# Patient Record
Sex: Male | Born: 1970 | Race: White | Hispanic: No | Marital: Married | Smoking: Never smoker
Health system: Southern US, Community
[De-identification: ages and names within clinical notes are randomized; demographics above are authoritative.]

## PROBLEM LIST (undated history)

## (undated) DIAGNOSIS — F419 Anxiety disorder, unspecified: Secondary | ICD-10-CM

## (undated) DIAGNOSIS — G4733 Obstructive sleep apnea (adult) (pediatric): Secondary | ICD-10-CM

## (undated) DIAGNOSIS — E669 Obesity, unspecified: Secondary | ICD-10-CM

## (undated) DIAGNOSIS — E785 Hyperlipidemia, unspecified: Secondary | ICD-10-CM

## (undated) DIAGNOSIS — I10 Essential (primary) hypertension: Secondary | ICD-10-CM

## (undated) DIAGNOSIS — K4090 Unilateral inguinal hernia, without obstruction or gangrene, not specified as recurrent: Secondary | ICD-10-CM

## (undated) DIAGNOSIS — R3914 Feeling of incomplete bladder emptying: Secondary | ICD-10-CM

## (undated) HISTORY — DX: Obesity, unspecified: E66.9

## (undated) HISTORY — DX: Anxiety disorder, unspecified: F41.9

## (undated) HISTORY — DX: Essential (primary) hypertension: I10

## (undated) HISTORY — PX: OTHER SURGICAL HISTORY: SHX169

## (undated) HISTORY — PX: SHOULDER SURGERY: SHX246

## (undated) HISTORY — PX: GALLBLADDER SURGERY: SHX652

## (undated) HISTORY — DX: Hyperlipidemia, unspecified: E78.5

## (undated) HISTORY — DX: Obstructive sleep apnea (adult) (pediatric): G47.33

---

## 2001-01-08 ENCOUNTER — Encounter: Admission: RE | Admit: 2001-01-08 | Discharge: 2001-04-08 | Payer: Self-pay | Admitting: Anesthesiology

## 2001-12-04 ENCOUNTER — Ambulatory Visit (HOSPITAL_COMMUNITY): Admission: RE | Admit: 2001-12-04 | Discharge: 2001-12-04 | Payer: Self-pay | Admitting: *Deleted

## 2001-12-04 ENCOUNTER — Encounter: Payer: Self-pay | Admitting: *Deleted

## 2001-12-06 ENCOUNTER — Encounter: Payer: Self-pay | Admitting: *Deleted

## 2001-12-06 ENCOUNTER — Ambulatory Visit (HOSPITAL_COMMUNITY): Admission: RE | Admit: 2001-12-06 | Discharge: 2001-12-07 | Payer: Self-pay | Admitting: *Deleted

## 2001-12-06 ENCOUNTER — Encounter: Payer: Self-pay | Admitting: Neurosurgery

## 2001-12-28 ENCOUNTER — Ambulatory Visit (HOSPITAL_COMMUNITY): Admission: RE | Admit: 2001-12-28 | Discharge: 2001-12-28 | Payer: Self-pay | Admitting: *Deleted

## 2001-12-28 ENCOUNTER — Encounter: Payer: Self-pay | Admitting: *Deleted

## 2002-01-07 ENCOUNTER — Ambulatory Visit (HOSPITAL_COMMUNITY): Admission: RE | Admit: 2002-01-07 | Discharge: 2002-01-07 | Payer: Self-pay | Admitting: *Deleted

## 2002-01-07 ENCOUNTER — Encounter: Payer: Self-pay | Admitting: *Deleted

## 2002-01-28 ENCOUNTER — Ambulatory Visit (HOSPITAL_COMMUNITY): Admission: RE | Admit: 2002-01-28 | Discharge: 2002-01-28 | Payer: Self-pay | Admitting: *Deleted

## 2002-01-28 ENCOUNTER — Encounter: Payer: Self-pay | Admitting: *Deleted

## 2002-01-31 ENCOUNTER — Ambulatory Visit (HOSPITAL_COMMUNITY): Admission: RE | Admit: 2002-01-31 | Discharge: 2002-01-31 | Payer: Self-pay | Admitting: *Deleted

## 2002-01-31 ENCOUNTER — Encounter: Payer: Self-pay | Admitting: *Deleted

## 2002-02-04 ENCOUNTER — Ambulatory Visit (HOSPITAL_COMMUNITY): Admission: RE | Admit: 2002-02-04 | Discharge: 2002-02-04 | Payer: Self-pay | Admitting: *Deleted

## 2002-02-04 ENCOUNTER — Encounter: Payer: Self-pay | Admitting: *Deleted

## 2002-02-11 ENCOUNTER — Ambulatory Visit (HOSPITAL_COMMUNITY): Admission: RE | Admit: 2002-02-11 | Discharge: 2002-02-12 | Payer: Self-pay | Admitting: *Deleted

## 2002-02-11 ENCOUNTER — Encounter: Payer: Self-pay | Admitting: *Deleted

## 2002-02-26 ENCOUNTER — Ambulatory Visit (HOSPITAL_COMMUNITY): Admission: RE | Admit: 2002-02-26 | Discharge: 2002-02-26 | Payer: Self-pay | Admitting: *Deleted

## 2002-02-26 ENCOUNTER — Encounter: Payer: Self-pay | Admitting: *Deleted

## 2002-10-02 ENCOUNTER — Emergency Department (HOSPITAL_COMMUNITY): Admission: EM | Admit: 2002-10-02 | Discharge: 2002-10-02 | Payer: Self-pay

## 2005-05-25 ENCOUNTER — Encounter: Payer: Self-pay | Admitting: *Deleted

## 2005-08-26 ENCOUNTER — Observation Stay (HOSPITAL_COMMUNITY): Admission: EM | Admit: 2005-08-26 | Discharge: 2005-08-26 | Payer: Self-pay | Admitting: Emergency Medicine

## 2005-08-26 HISTORY — PX: CARDIAC CATHETERIZATION: SHX172

## 2009-04-13 ENCOUNTER — Encounter: Admission: RE | Admit: 2009-04-13 | Discharge: 2009-04-13 | Payer: Self-pay | Admitting: Internal Medicine

## 2009-04-13 ENCOUNTER — Encounter: Payer: Self-pay | Admitting: Family Medicine

## 2009-04-21 ENCOUNTER — Ambulatory Visit: Payer: Self-pay | Admitting: Family Medicine

## 2009-04-21 DIAGNOSIS — M67919 Unspecified disorder of synovium and tendon, unspecified shoulder: Secondary | ICD-10-CM | POA: Insufficient documentation

## 2009-04-21 DIAGNOSIS — M25519 Pain in unspecified shoulder: Secondary | ICD-10-CM | POA: Insufficient documentation

## 2009-04-21 DIAGNOSIS — M719 Bursopathy, unspecified: Secondary | ICD-10-CM

## 2009-05-05 ENCOUNTER — Encounter: Admission: RE | Admit: 2009-05-05 | Discharge: 2009-05-14 | Payer: Self-pay | Admitting: Sports Medicine

## 2009-05-12 ENCOUNTER — Ambulatory Visit: Payer: Self-pay | Admitting: Sports Medicine

## 2009-05-12 ENCOUNTER — Encounter (INDEPENDENT_AMBULATORY_CARE_PROVIDER_SITE_OTHER): Payer: Self-pay | Admitting: *Deleted

## 2009-05-13 ENCOUNTER — Encounter: Payer: Self-pay | Admitting: Family Medicine

## 2009-05-20 ENCOUNTER — Encounter (INDEPENDENT_AMBULATORY_CARE_PROVIDER_SITE_OTHER): Payer: Self-pay | Admitting: *Deleted

## 2009-05-21 ENCOUNTER — Encounter (INDEPENDENT_AMBULATORY_CARE_PROVIDER_SITE_OTHER): Payer: Self-pay | Admitting: *Deleted

## 2009-05-22 ENCOUNTER — Encounter: Payer: Self-pay | Admitting: Family Medicine

## 2009-07-09 ENCOUNTER — Encounter: Payer: Self-pay | Admitting: Cardiology

## 2009-07-09 HISTORY — PX: NM MYOVIEW LTD: HXRAD82

## 2009-07-23 ENCOUNTER — Ambulatory Visit (HOSPITAL_BASED_OUTPATIENT_CLINIC_OR_DEPARTMENT_OTHER): Admission: RE | Admit: 2009-07-23 | Discharge: 2009-07-23 | Payer: Self-pay | Admitting: Orthopedic Surgery

## 2010-02-23 NOTE — Letter (Signed)
Summary: [facility]  Referral Form  [facility]  Referral Form   Imported By: Marily Memos 04/21/2009 13:52:54  _____________________________________________________________________  External Attachment:    Type:   Image     Comment:   External Document

## 2010-02-23 NOTE — Miscellaneous (Signed)
Summary: ORTHO APPT   PER BRIDGETT DENNIS, CLAIM ADJUSTER, PT HAS APPT ON 05-22-09 WITH DR MURPHY AT 9:15AM. FAXED RECORDS TO [phone].

## 2010-02-23 NOTE — Letter (Signed)
Summary: Out of Work  [facility]  [address]   [place], [place] [address]   Phone: [phone]  Fax: [phone]    May 12, 2009   Employee:  James Stout    To Whom It May Concern:   For Medical reasons, please excuse the above named employee from work for the following dates:  Start:  May 12, 2009   End:  May 12, 2009 4:16 PM   If you need additional information, please feel free to contact our office.         Sincerely,    Lillia Pauls CMA

## 2010-02-23 NOTE — Assessment & Plan Note (Signed)
Summary: F/U,MC   Vital Signs:  Patient profile:   40 year old male BP sitting:   138 / 92  Vitals Entered By: Lillia Pauls CMA (May 12, 2009 3:48 PM)  History of Present Illness: 40 yo M presents for f/u of right shoulder pain.  Patient on 03/24/09 was lifting a case of eggs with his left arm to push it over and felt a pop (similar to a knuckle pop) within his left shoulder. No obvious bruising. Had pain with this. Tried subacromial injection without relief. Tramadol and nsaids minimally helpful. Was felt on ultrasound to have a partial tear of subscap Seen 3 weeks ago by Dr. Patsy Lager - rx'd home exercises and PT Has been compliant with this and shoulder pain is worsening with physical therapy. X-rays done after injury negative for fracture or bony bankart. Is at work on limited duty currently. Pain worse with overhead activities and feels unstable with certain motions.  Allergies (verified): No Known Drug Allergies  Physical Exam  General:  Well-developed,well-nourished,in no acute distress; alert,appropriate and cooperative throughout examination Msk:  Shoulder: LEFT No gross deformity, swelling or bruising. Mild TTP anterior shoulder - minimal at [facility] joint. FROM with painful arc. Strength 4/5 with empty can, 5/5 and not painful with IR/ER. + apprehension sign. Neg drop arm. Mildly positive hawkins and neers. Neg speeds and yergasons. NVI distally.   Impression & Recommendations:  Problem # 1:  SHOULDER PAIN, LEFT (ICD-719.41) Assessment Deteriorated Despite 7 weeks of conservative therapy including physical therapy, patient continues to have pain that is worsening within left shoulder.  Also noted to have + apprehension sign and pain with empty can on today's exam.  Concerned he may have subluxed his shoulder with initial injury and has recurrent instability.  Would expect with partial tear or impingement his symptoms to be improving with injection and therapy and  this is not the case.  Recommend proceeding with orthopedic referral and possible MR arthrogram to delineate damage to labrum which cannot be seen on ultrasound.  Ideally should have this addressed before returning to work.  Will place restrictions of no lifting > 5 pounds.  No overhead activities.  His updated medication list for this problem includes:    Vicodin 5-500 Mg Tabs (Hydrocodone-acetaminophen) .Marland Kitchen... 1 by mouth q 4 hours as needed pain  Complete Medication List: 1)  Vicodin 5-500 Mg Tabs (Hydrocodone-acetaminophen) .Marland Kitchen.. 1 by mouth q 4 hours as needed pain  Patient Instructions: 1)  We will recommend to workers comp that you see an orthopedic surgeon since you are not improving with conservative treatment. 2)  It's ok to continue with physical therapy. 3)  At work no overhead lifting and no lifting over 10 pounds while waiting on the referral. 4)  No driving or operating machinery while taking vicodin. Prescriptions: VICODIN 5-500 MG TABS (HYDROCODONE-ACETAMINOPHEN) 1 by mouth q 4 hours as needed pain  #30 x 0   Entered and Authorized by:   Norton Blizzard MD   Signed by:   Norton Blizzard MD on 05/12/2009   Method used:   Print then Give to Patient   RxID:   [id number]

## 2010-02-23 NOTE — Letter (Signed)
Summary: Out of Work  [facility]  [address]   [place], [place] [address]   Phone: [phone]  Fax: [phone]    April 21, 2009   Employee:  James Stout    To Whom It May Concern:   For Medical reasons, please excuse the above named employee from work for the following dates:  Start:   04/21/3009  End:   4/21/o2011 - limitation of lifting, maximum 10 pounds overhead, f/u with me in 3-4 weeks. OK to roll pallets. Otherwise, no limitation and OK to work  If you need additional information, please feel free to contact our office.         Sincerely,    Hannah Beat MD

## 2010-02-23 NOTE — Assessment & Plan Note (Signed)
Summary: James Stout   Vital Signs:  Patient profile:   40 year old male Height:      71 inches Weight:      252 pounds BMI:     35.27 BP sitting:   142 / 89  Vitals Entered By: Lillia Pauls CMA (April 21, 2009 9:44 AM)  History of Present Illness: 40 year old male:  Initially saw Dr. Alto Denver, no lifting over 10 pounds for a week. Then saw another doctor. Took some xrays then -- thought may have a partial vs. full thickness tear.   Had a biceps tendon rupture on the right shoulder and R SAD, DCE.  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 Has tried some heat, alleve, tried some icy - hot and some cream.  s/p subacromial injection x 1 of the Left shoulder  Had a case of eggs under the bottom and pushed with the palm of his hand, and felt like a knuckle pop.  primary pain with IROM, some with EROM, mild pain with abduction  MISC. 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 Evidence of calcification in subscapularis c/w prior tear and healing calcific tendonitis. Supraspinatus and infraspinatus intact, minimal calfication on supraspinatus. No full thickness tear.  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 Otherwise, the pertinent positives and negatives are listed above and in the HPI, otherwise a full review of systems has been reviewed and is negative unless noted positive.   Physical Exam  General:  GEN: Well-developed,well-nourished,in  no acute distress; alert,appropriate and cooperative throughout examination HEENT: Normocephalic and atraumatic without obvious abnormalities. No apparent alopecia or balding. Ears, externally no deformities PULM: Breathing comfortably in no respiratory distress EXT: No clubbing, cyanosis, or edema PSYCH: Normally interactive. Cooperative during the interview. Pleasant. Friendly and conversant. Not anxious or depressed appearing. Normal, full affect.  Msk:  Shoulder: LEFT Inspection: No muscle wasting or winging Ecchymosis/edema: neg  AC joint, scapula, clavicle: TTP at [facility] Cervical spine: NT, full ROM Spurling's: neg Abduction: full, 5/5 - mild pain arc of moion Flexion: full, 5/5 IR, full, lift-off: 5/5, painful ER at neutral: full, 5/5, mild pain AC crossover: pos Neer: eg Hawkins: pos Drop Test: neg Empty Can: neg Supraspinatus insertion: mild-mod T Bicipital groove: NT Speed's: neg Yergason's: neg Sulcus sign: neg Scapular dyskinesis: none C5-T1 intact  Neuro: Sensation intact Grip 5/5    Impression & Recommendations:  Problem # 1:  ROTATOR CUFF INJURY, LEFT SHOULDER (ICD-726.10) Assessment New  c/w partial thickness, primarily subscap tear with evidence of healing. No bursitis.  Shoulder anatomy was reviewed with the patient. Rotator cuff strengthening and scapular stabilization exercises were reviewed with the patient.   Retraining shoulder mechanics and function was emphasized to the patient with rehab done at least 5-6 days a week.  formal PT to assist with scapular stabilization and RTC strengthening.   cc: Dr. Marny Lowenstein  Orders: US EXTREMITY NON-VASC REAL-TIME IMG [phone])  Problem # 2:  SHOULDER PAIN, LEFT (ICD-719.41) Assessment: New  Patient Instructions: 1)  f/u 3-4 weeks    Appended Document: NP,SHOULDER PAIN,MC OK to call in Vicodin 5/500, 1 by mouth q 4 hours as needed pain, #30, 0 refills  Cannot drive or operate machinery while taking  this.  Appended Document: NP,SHOULDER PAIN,MC Prescriptions: VICODIN 5-500 MG TABS (HYDROCODONE-ACETAMINOPHEN) 1 by mouth q 4 hours as needed pain  #30 x 0   Entered by:   Lillia Pauls CMA   Authorized by:   Hannah Beat MD   Signed by:   Lillia Pauls CMA on 04/27/2009   Method used:   Print then Give to Patient   RxID:   [id number]  called into [facility] on [address]

## 2010-02-23 NOTE — Miscellaneous (Signed)
  NEW ADJUSTER IS : BRIDGETT DENNIS CLAIM NUMBER IS: [id number] [phone]  ALL RECORDS FAXED TO [facility] THEY WILL CALL PT WITH ORTHO APPT

## 2010-02-23 NOTE — Consult Note (Signed)
Summary: Consultation Report  Consultation Report   Imported By: Marily Memos 04/28/2009 09:48:12  _____________________________________________________________________  External Attachment:    Type:   Image     Comment:   External Document

## 2010-02-23 NOTE — Consult Note (Signed)
Summary: [facility]  [facility]   Imported By: Marily Memos 05/28/2009 09:22:08  _____________________________________________________________________  External Attachment:    Type:   Image     Comment:   External Document

## 2010-02-23 NOTE — Letter (Signed)
Summary: *Consult Note  [facility]  [address]   [place], [place] [address]   Phone: [phone]  Fax: [phone]    Re:    James Stout DOB:    21-May-1970   Dear Dr Marny Lowenstein:    Thank you for requesting that we see the above patient for consultation.  A copy of the detailed office note will be sent under separate cover, for your review.  Evaluation today is consistent with:  Left shoulder pain due to shoulder subluxation, rotator cuff tear Failure of 7 weeks of conservative therapy without any improvement  Our recommendation is for:  Orthopedic surgeon referral for consideration of surgical intervention, possibly MR arthrogram to assess labrum, rotator cuff further.  After today's visit, the patients current medications include: 1)  VICODIN 5-500 MG TABS (HYDROCODONE-ACETAMINOPHEN) 1 by mouth q 4 hours as needed pain   Thank you for this consultation.  If you have any further questions regarding the care of this patient, please do not hesitate to contact me @ [phone].  Thank you for this opportunity to look after your patient.  Sincerely,   Norton Blizzard MD

## 2010-04-11 LAB — BASIC METABOLIC PANEL
BUN: 13 mg/dL (ref 6–23)
CO2: 24 mEq/L (ref 19–32)
Calcium: 9.3 mg/dL (ref 8.4–10.5)
Chloride: 105 mEq/L (ref 96–112)
Creatinine, Ser: 0.91 mg/dL (ref 0.4–1.5)
GFR calc Af Amer: >60 mL/min (ref >60)
GFR calc non Af Amer: >60 mL/min (ref >60)
Glucose, Bld: 150 mg/dL — ABNORMAL HIGH (ref 70–99)
Potassium: 4.8 mEq/L (ref 3.5–5.1)
Sodium: 138 mEq/L (ref 135–145)

## 2010-06-11 NOTE — Op Note (Signed)
NAME:  James Stout, James Stout                        ACCOUNT NO.:  [id number]   MEDICAL RECORD NO.:  [id number]                   PATIENT TYPE:  OIB   LOCATION:  3014                                 FACILITY:  [facility]   PHYSICIAN:  Ricky D. Gasper Sells, M.D.             DATE OF BIRTH:  February 29, 1970   DATE OF PROCEDURE:  12/06/2001  DATE OF DISCHARGE:                                 OPERATIVE REPORT   OPERATION:  1. C5-6 and C6-7 microdiskectomy.  2. Corpectomy of C6 for autograft.  3. Strut graft fibular fusion C4 to C6 with allograft and laterally C5-6 and     C6-7 autograft plus EBX bone paste.  4. Fixation with Codman Slimline titanium plate   SURGEON:  Ricky D. Gasper Sells, M.D.   ASSISTANT:  Izell Hamilton. Elesa Hacker, M.D.   COMPLICATIONS:  None.   ESTIMATED BLOOD LOSS:  Less than 100 cc.   INDICATIONS:  This is a 40 year old male who injured his neck in June of  2002. In November 2002 he had an MRI which showed a large herniated nucleus  pulposus at C5-6 and C6-7 on the left. Nevertheless he did not have anything  done for it.  Eventually Dr. Renae Fickle sent him along to see me and I saw him  last week. I did a myelogram CT scan which showed mixed hard and soft disk  on the left at C5-6 and C6-7, moreso at C5-6 and C6-7 consistent with the  numbness and tingling  of the thumb and index into the finger of the left  hand and weakness in the biceps and triceps along with neck pain.   It was elected to decompress him as soon as possible. He was taken to the  operating room where an uncomplicated approach was made to the anterior C5-6  and C6-7 levels, x-ray confirmed. Apart from the obvious nicks hard and soft  disk laterally at C5-6 and C6-7 to the left, the other significant finding  was that  the posterior and longitudinal ligament was markedly thickened and  somewhat more opaque than normal. It did not appear to be calcified.  The  operation itself was uncomplicated. The patient tolerated the  procedure well  and awoke from surgery moving all  four extremities.   DESCRIPTION OF PROCEDURE:  With the patient in the prone position the  patient was prepped and draped in the usual fashion for an anterior approach  to the cervical spine. All pressure points were carefully inspected and  padded and a Bair Hugger blanket was in place.   A 5 cm incision was made in a convenient skin fold in the right anterior  triangle and subcutaneous hemostasis was  achieved using unipolar  coagulation. The  subcutaneous dissection was carried out with the  Metzenbaum scissors and the platysma was split along its muscle fibers,  retracted medially and laterally with a total of  four fishhooks.   Dissection  was carried down to the carotid artery and when that was reached,  the anterior cervical spine was identified. The longus coli muscles were  dissected free and one of the disks which was presumed to be the C4-5 was  marked with a green skin marking pencil and a needle was placed in it and an  intraoperative control x-ray confirmed it was the C4-5 level.  The  shoulders were very prominent. The levels below this level could not be  identified.   Following that the longus coli muscles were dissected free of the C5-C6 and  C7 vertebral bodies. A Caspar pin was placed in the body of C5 and C7, and  then Caspar retractors were placed in the longus coli muscles to expose the  C5 through C7 levels anteriorly and the levels were distracted with the  retractor instrument approximately 3 mm.   Using the microscope for magnification, the annulus of C5-6 and C6-7 was  incised in a trapezoidal fashion, and a diskectomy was performed with the  pituitary instrument. Using the high speed drill, the cartilage endplate of  the C5-6 and C6-7 was then removed and the remaining disk material was  removed out of  the posterior longitudinal ligament, and then with the  cartilage removed from the vertebral body of C6,  a partial corpectomy of C6  was performed and bone was safely made a bony fusion autograft.   Starting at C6-7, the posterior longitudinal ligament was identified and  split with the nerve hook. It was thickened and translucent, but more opaque  than normal. It was removed with 1 mm, 2 mm and 3 mm instruments, Kerrison  punches. The freed edge of the ligament was then coagulated aggressively  with bipolar coagulation. A foraminotomy of C7 was performed to  make sure  that the nerve root had plenty of room. Exactly the same procedures and  observations were carried out at the C5-6 level. Perhaps there was a larger  osteophyte laterally at C6 on the left, but otherwise is essentially the  same procedure.   A custom fashioned tibular strut graft was then formed to fit the C5 through  C7 anteriorly to the appropriate depth and length, and truncated large  dimension anterior, small dimension posterior so as the semi constrained  Codman plate would allow it to collapse into slight lordosis.  This was then  pounded into place, having  countersunk the bone at C5 and C7 somewhat. An  appropriate length Codman titanium Slimline plate was then placed between C5  and C6 and held in place with the temporary pins.   Superiorly a 16 mm self tapping screw was angled superiorly and screwed into  place on the left initially and  then at C7. A 14 mm self tapping screw was  placed and then on the right at C5 and on the left in the same fashion, two  more screws were placed.  In the mid screw, a 14 mm hole was drilled by hand  alongside the bone strut, and 14 mm self tapping screws were inserted at  that level as well. These were all locked in place. An intraoperative x-ray  was not done because we could not see the plate anyway because of the  shoulders.   The wound was then irrigated with Betadine solution and hydrogen peroxide  and finally Bacitracin solution. The self-retaining retractors were  removed. The platysma was closed with interrupted 2-0 Vicryl, and then when it was  closed, vancomycin  solution was injected into the subplatysmal space and was  oozing from that space. Subcutaneous closure was carried out with 4-0 Vicryl  in an interrupted fashion and Steri-Strips were placed in the skin. Some  subcutaneous vancomycin was placed but it expressed itself from the wound  rapidly.   The patient tolerated the procedure well and awoke from surgery  unremarkably.  A  soft dry dressing was placed on the wound and he was  placed in a Newton brace.                                               Ricky D. Gasper Sells, M.D.    RDH/MEDQ  D:  12/06/2001  T:  12/06/2001  Job:  [id number]

## 2010-06-11 NOTE — Op Note (Signed)
NAME:  James Stout, James Stout                        ACCOUNT NO.:  [id number]   MEDICAL RECORD NO.:  [id number]                   PATIENT TYPE:  OIB   LOCATION:  3172                                 FACILITY:  [facility]   PHYSICIAN:  Ricky D. Gasper Sells, M.D.             DATE OF BIRTH:  [birth year]   DATE OF PROCEDURE:  12/28/2001  DATE OF DISCHARGE:                                 OPERATIVE REPORT   PREOPERATIVE DIAGNOSIS:  Loose C5 screw left side, status post anterior disc  and fusion C5-C7, 12/06/2001.   PROCEDURE:  Removal of left 16 millimeter C5 self-drilling screw and  replacement with a blunt screw from the old Codman set which is compatible  with the new set, 15 millimeters, having packed the hole with vancomycin  soaked Surgicel and locking the screw in place. Use of microscope.   SURGEON:  Ricky D. Gasper Sells, M.D.   ASSISTANT:  Cloniger.   ANESTHESIA:  General controlled.   COUNTS:  Sponge, needle and instrument counts correct.   COMPLICATIONS:  Nil.   MEDIA:  Esophagus intact to normal saline, 3% hydrogen peroxide, 50/50 mix,  50 cc mixed with methylene blue, no evidence of problems with the esophagus.   SUMMARY:  This is a 40 year old male, that underwent an anterior C5-C7 strut  graft fusion with carpectomy of C5 and fixation with low profile Codman  titanium plate.  He did very well postoperatively until about five days ago  when he sneezed violently and noted some pain in his neck.  I saw him in the  office in followup.  He was intact neurologically with no arm pain and some  posterior neck pain.  He had the trouble that he was having following  postoperatively resolved.  Unfortunately, however, lateral x-ray done in the  office showed that he backed out one of his upper screws, which turned out  to be the left side.  He must have twisted his neck when he coughed and this  caused that screw to pop out by itself, which is the only mechanism I can  think of.   Because  it was so sharp, I decided to go ahead and explore him before he was  totally scarred down and this was carried out today.  An orogastric tube was  left in his esophagus to facilitate avoiding getting into it when  dissecting.  The integrity of the esophagus at the end of the operation was  assessed and was found to be intact by slowly infusion a total of 50 cc at  50/50 normal saline and hydrogen peroxide, and 1 amp of methyl ine blue,  then withdrawing the tube.  There was no bubbling and no evidence of a leak,  so can assume the esophagus was intact.   The screw was removed.  The other screws were assessed by not only x-ray,  but also palpation, and found to be intact.  No  complications occurred.  The  patient tolerated the procedure well.  The wound was carefully inspected  with the microscope before closure.   DESCRIPTION OF PROCEDURE:  With the patient in the supine position with the  head slightly extended, the patient was prepped and draped, and all pressure  points were inspected and padded in the usual fashion for an anterior  approach to the cervical spine.  Previous right anterior triangle incision  was re-incised.  The mid part of the wound was a little bit broken down,  certainly not infected.  This was resected to be closed in a classic fashion  at the end of the case.  A small elliptical tag of the skin was removed,  probably where there was a suture.   Subcutaneously, superior to the incision, there was another granulomatous  type of reaction to the 2-0 Vicryl used to close the platysma.  This was  resected.  He seemed to be reacting quite strongly to the Vicryl suture  material.  Anyway, the platysma was split and the plane was developed along  the medial border of the sternocleidomastoid muscle and the internal jugular  vein, as well as the carotid.  The anterior cervical spine was encountered.  It had scarred over.  The underlying plate was carefully identified,  keeping  the esophagus well medial.  The protruding screw was identified and  dissected free of the soft tissues.  The surgical assistant, as well as the  staff in the Operating Room, viewed the screw through the microscope and an  interoperative control x-ray was taken to confirm his malposition.   The remainder of the screws were then identified, palpated and found to be  clear, and with the locking cam in place.  Interestingly, this screw jumped  with the cam in place, perhaps because of its angle, perhaps because of the  violence of the patient's sneeze.  Whatever the case, it was removed.  The  screw hole was packed with a 39-cent postage stamp piece of Surgicel soaked  with vancomycin and a 15 mm old system Codman's screw with a blunt tip was  screwed firmly into that hole and then locked into place with the cam.  The  wound was irrigated with Betadine solution and hydrogen peroxide and all  dissection was carried out with hand held retractors to minimize trauma to  the esophagus and trachea, as well as the other structures in the region  including carotid artery, laryngeal nerve and the internal jugular vein.   Under direct visualization the orogastric tube was withdrawn while injecting  a mixture of normal saline and hydrogen peroxide solution with methylene  blue and no evidence of any esophageal tears were identified.  The platysma  was closed with interrupted 4-0 Vicryl sutures and a fine needle times two,  and subcutaneous closure was carried out inverted interrupted 4-0 Vicryl  times four and the skin was closed with Steri-Strips.  The patient tolerated  the procedure well and woke up from the surgery unremarkably,  somewhat  agitated, but moving all four extremities.                                               Ricky D. Gasper Sells, M.D.    RDH/MEDQ  D:  12/28/2001  T:  12/28/2001  Job:  [id number]

## 2010-06-11 NOTE — Discharge Summary (Signed)
NAME:  James Stout, James Stout                        ACCOUNT NO.:  [id number]   MEDICAL RECORD NO.:  [id number]                   PATIENT TYPE:  OIB   LOCATION:  3014                                 FACILITY:  [facility]   PHYSICIAN:  Ricky D. Gasper Sells, M.D.             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   OPERATION:  1. Anterior C5-6 and C6-7 microdiskectomies.  2. Corpectomy of C6 for autograft.  3. Allograft, fibular strut graft and autograft, lateral C5-6 and C6-7, bone     placement for fusion.  4. Placement of Codman titanium plate with 3.5 x 14.0-mm screws in C6 and C7     and 3.5 x 16.0-mm screws in C5.  Postoperative films show good     positioning of fusion, no hardware complications and no problems.   SURGEON:  Ricky D. Gasper Sells, M.D.   ASSISTANT:  Izell Jasper. Deaton, M.D.   SUMMARY:  This is a 40 year old male with a long-standing left-sided C5 and  C6 radiculopathy, both motor and sensory.  He had an MRI a year before and  it showed extradural defects on the left at C5-6 and C6-7 and he chose not  to do anything for this, hoping it would get better.   It did not.  He had a dense numbness of his thumb, index and middle finger  on the left side as well as weakness of the biceps and triceps.  He was  dropping things.  On one occasion, he burned his forearm and decided he  needed to have something done for this.  Meanwhile, he let his insurance  lapse, so he had trouble finding anybody to offer it on him and because he  is on Medicare.  Nevertheless, I had decided to take him in and because he  had clearcut abnormalities on his MRI and clearcut clinical  presentation, we  elected to go ahead and do a myelogram and evaluate him.  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   He was taken to the operating room, where an x-ray-controlled approach was  made to these levels.  The disks were removed.  The C6 and C7 nerve roots  were decompressed in the usual fashion.   The only problem that occurred was that he vomited a little bit while he was  being intubated; there was no clearcut aspiration.   Postoperative films showed some vascular congestion and no consolidation.  He remained afebrile throughout his visit.  His chest is clear at time of  discharge, he had no respiratory difficulty, his O2 saturations were 99% on  2 L, so it was decided to watch him.   From a neurological point of view, his sensation in his left hand improved  to the point where he had a little bit of numbness at the tip of his left  thumb, his index and middle finger were no longer numb, he had no weakness  in the biceps or triceps; in other words, he significantly improved.  His  voice was intact.  He had minimal trouble swallowing, which is normal, which  was improving.  He was discharged home.   DISCHARGE DIAGNOSIS:  Status post C5 through C7 anterior diskectomies and  fusion and fixation with Codman titanium plate.   DISCHARGE MEDICATIONS:  1. Cipro 500 mg p.o. b.i.d.  2. Lorcet 10 mg one every four hours p.r.n.                                               Ricky D. Gasper Sells, M.D.    RDH/MEDQ  D:  12/07/2001  T:  12/08/2001  Job:  [id number]   cc:   Deidre Ala, M.D.  [address]  [place], [place] [address]  Fax: [phone]

## 2010-06-11 NOTE — H&P (Signed)
James Stout, James Stout              ACCOUNT NO.:  [id number]   MEDICAL RECORD NO.:  [id number]          PATIENT TYPE:  OBV   LOCATION:  4738                         FACILITY:  [facility]   PHYSICIAN:  Elmore Guise., M.D.DATE OF BIRTH:  [birth year]   DATE OF ADMISSION:  08/25/2005  DATE OF DISCHARGE:  08/26/2005                                HISTORY & PHYSICAL   INDICATION FOR ADMISSION:  Chest pain.   HISTORY OF PRESENT ILLNESS:  The patient very pleasant 40 year old white  male past medical history of obesity, obstructive sleep apnea, dyslipidemia,  hypertension who presented with acute onset of substernal chest pain.  The  patient reports normal state of health until earlier this evening.  He has  been doing his normal activities without any limitations.  Tonight while  working on a computer he had a acute onset of chest tightness associated  with nausea and shortness of breath.  He also has reported mild sweating and  diaphoresis.  His symptoms continued until he was given one sublingual  nitroglycerin with total relief of symptoms.  He has had no further problems  since being in the emergency department.  EMS did report he was cool, clammy  prior to arrival.   REVIEW OF SYSTEMS:  Positive for off and on abdominal cramping, decreased  energy, fatigue and malaise for the last 2 days.  He has had dyspnea on  exertion and 40 pounds weight gain over the last couple years.  His wife  reports he has a heavy snorer.   CURRENT MEDICATIONS:  Are none.  He used to take felodipine, Zocor and  Toprol.   ALLERGIES:  None.   FAMILY HISTORY:  Is positive for hypertension, diabetes.   SOCIAL HISTORY:  He is married.  He has two children.  No tobacco or  alcohol.   EXAM:  He is afebrile.  Blood pressure 120/60, heart rate 86, respiratory  rate 18.  His sat is 96% on room air.  GENERAL:  He is very pleasant young male alert and oriented x4.  No acute  distress.  He has no JVD and no  bruits.  LUNGS:  Were clear.  HEART:  Is regular with a normal S1-S2.  No significant murmur, gallops,  rubs.  ABDOMEN:  Soft, nontender, nondistended.  No rebound or guarding.  EXTREMITIES:  Warm with 2+ pulses and no significant edema.   LABORATORY VALUES:  Showed a potassium of 3.9, BUN and creatinine of 12 and  1.0.  Cardiac markers were negative x3.  Hemoglobin was 15.8 and D-dimer was  negative.  His chest x-ray showed no acute cardiopulmonary disease.  His ECG  showed normal sinus rhythm, 85 per minute with rightward axis, incomplete  right bundle branch block and poor R-wave progression throughout his  precordial leads.   IMPRESSION:  1. Chest pain.  2. Multiple cardiac risk factors including obesity, dyslipidemia,      hypertension, abnormal ECG.   PLAN:  The patient will be admitted for observation.  We will give one shot  of Lovenox.  Continue aspirin 325 mg daily, serial enzymes.  Will restart  his Toprol, check hemoglobin A1c, TSH, lipids and schedule the patient for  cardiac catheterization.      Elmore Guise., M.D.  Electronically Signed     TWK/MEDQ  D:  09/14/2005  T:  09/14/2005  Job:  [id number]

## 2010-06-11 NOTE — Op Note (Signed)
[facility]  Patient:    James Stout, James Stout Visit Number: [id number] MRN: [id number]          Service Type: PMG Location: [facility] Attending Physician:  Rolly Salter Dictated by:   Jewel Baize Stevphen Rochester, M.D. Proc. Date: 01/09/01 Admit Date:  01/08/2001   CC:         Dr. Renae Fickle   Operative Report  HISTORY OF PRESENT ILLNESS:  [name] comes under pain management today as a kind referral from Dr. Renae Fickle. He is a pleasant 40 year old with a declining functional indices and qualify of life indices secondary to ______ with known degenerative disease at C5-6 and 6-7. He relates this pain primarily on the paracervical region with right and left involvement. He has right and left arm pain, left greater than right involving the thumb and middle finger and consistent distribution C5-6, 6-7. He describes this as numb, sharp, sometimes burning and aching, not improved with medications or rest, heat, ice or therapy, made worse by working. Tests include x-rays and MRI and I reviewed the MRI. He relates no specific injury. He takes no particular medication but took Vicodin with some modest relief cycling.  ALLERGIES:  No declared. He states he has no known drug allergies. He had some nonspecific reaction from an interscalene block, so described, from a shoulder reconstruction on the right side and this sounds ______ possibly even dural violation. He was successfully resuscitated with no residual. This was sometime ago and I relieved there anxiety regarding procedure today. He states no wish to harm self or others. A 14 point review of systems, health and history form are reviewed.  PAST MEDICAL HISTORY:  Remarkable for hypertension, well controlled. Shoulder reconstruction.  FAMILY HISTORY:  Remarkable for lung disease, cancer, diabetes, and hypertension.  SOCIAL HISTORY:  Nonsmoker, nondrinker. He is married. I discussed this procedure with his wife who is present.  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 He does like his job.  Review of systems, family and social history otherwise noncontributory to the pain problem.  PHYSICAL EXAMINATION:  GENERAL:  Reveals a pleasant male sitting comfortably in bed. Gait, affect, and appearance is normal. Oriented x 3.  HEENT:  Unremarkable.  CHEST:  Clear to auscultation and percussion. He has a regular rate and rhythm without rub, murmur or gallop.  ABDOMEN:  Soft, nontender, benign, no hepatosplenomegaly. He has diffuse paracervical myofascial discomfort.  NEUROLOGIC:  Impaired flexion, extension, and lateral rotational pain, positive cervical facetal compression test, left greater than right. He has no other over neurological deficit, motor, sensory reflex.  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  PLAN:  Cervical epidural. He has consent.  DESCRIPTION OF PROCEDURE:  The patient taken to the procedure area and appropriate fluoroscopically imaged. We then prepped and draped the neck in the usual fashion and advanced Hustead needle at C5-6 interspace without any evidence of CSF, heme or paraesthesia. Test block uneventfully following 40 mg of Aristocort and flushed the needle.  Tolerated the procedure well, no complications from our procedure. Discharge instructions were given. Appropriate recovery. Will see him in follow-up. Dictated by:   Jewel Baize Stevphen Rochester, M.D. Attending Physician:  Rolly Salter DD:  01/09/01 TD:  01/10/01 Job: [id number]

## 2010-06-11 NOTE — Discharge Summary (Signed)
James Stout, James Stout              ACCOUNT NO.:  [id number]   MEDICAL RECORD NO.:  [id number]          PATIENT TYPE:  OBV   LOCATION:  4738                         FACILITY:  [facility]   PHYSICIAN:  Elmore Guise., M.D.DATE OF BIRTH:  April 22, 1970   DATE OF ADMISSION:  08/25/2005  DATE OF DISCHARGE:  08/26/2005                                 DISCHARGE SUMMARY   DISCHARGE DIAGNOSES:  1. Non-obstructive coronary arteries.  2. History of hypertension.  3. History of dyslipidemia.  4. Obesity.   HISTORY OF PRESENT ILLNESS:  Patient is a very pleasant 40 year old white  male who was admitted for evaluation of chest pain.  Because of risk factors  and abnormal ECG patient underwent cardiac catheterization which showed  normal-appearing left system with mild luminal irregularities.  His RCA was  dominant with proximal 40-50% stenosis with catheter-induced vasospasm  noted.  Patient's post procedure course was uncomplicated.  We did have a  long discussion regarding his proper treatment.  We discussed weight loss,  continued statin, using nitroglycerin as needed for his vasospasm, continued  long-term aspirin, and beta blocker.  We may add back Plendil if he  continues to be symptomatic with using nitroglycerin more than a couple of  times per week.  His Discharge medications include :   Toprol XL 25 mg daily  Aspirin 325 mg daily  Zocor 40 mg daily, and  Nitroglycerin on a p.r.n. basis.   His follow-up will be with Dr. Lady Deutscher in one to two weeks.  He is to  call the office for appointment.  His restriction should be routine post  catheterization restrictions.  He was instructed not do any strenuous  activity or heavy lifting for the next 48 hours.      Elmore Guise., M.D.  Electronically Signed     TWK/MEDQ  D:  09/14/2005  T:  09/14/2005  Job:  [id number]
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
## 2010-06-11 NOTE — Cardiovascular Report (Signed)
NAMEREHAN, James Stout              ACCOUNT NO.:  [id number]   MEDICAL RECORD NO.:  [id number]          PATIENT TYPE:  INP   LOCATION:  4738                         FACILITY:  [facility]   PHYSICIAN:  Elmore Guise., M.D.DATE OF BIRTH:  January 26, 1970   DATE OF PROCEDURE:  08/26/2005  DATE OF DISCHARGE:                              CARDIAC CATHETERIZATION   INDICATIONS FOR PROCEDURE:  Unstable angina.   DESCRIPTION OF PROCEDURE:  The patient was brought to the cardiac cath lab  after appropriate informed consent.  He was prepped and draped in a sterile  fashion.  Approximately 10 mL of 1% lidocaine was used for local anesthesia.  A 6-French sheath was placed in the right femoral artery without difficulty.  Coronary angiography, LV angiography were performed.  Vessel spasm was noted  on injection of the right coronary artery.  This was treated with 200 mcg of  intracoronary nitroglycerin.  The patient tolerated the procedure well.  No  apparent complications.   FINDINGS:  1.  Left Main:  Normal.  2.  LAD:  Diffuse mild to moderate luminal irregularities.  3.  D1:  Moderate size mild luminal irregularities.  4.  LCX:  Nondominant with mild luminal irregularities.  5.  OM-1:  High branching with mild to moderate luminal irregularities.  6.  Ramus:  Mild to moderate luminal irregularities.  7.  RCA:  Dominant with proximal  eccentric 40-50% stenosis with catheter      induced spasm noted.  8.  PDA and PLV:  No significant disease.  9:  EF of 55-60%.  No wall motion abnormalities.  LVEDP is 13 mmHg.   IMPRESSION:  1.  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  2.  Nonobstructive left anterior descending, left circumflex coronary      artery, ramus intermediate with mild to moderate luminal irregularities.  3.  Normal left ventricular systolic function with an ejection fraction of  55-60%.   PLAN:  Aggressive risk factor modification as indicated.  We will start  Plendil for possible vasospasm component.      Elmore Guise., M.D.  Electronically Signed     TWK/MEDQ  D:  08/26/2005  T:  08/26/2005  Job:  [id number]

## 2010-07-12 ENCOUNTER — Encounter (HOSPITAL_BASED_OUTPATIENT_CLINIC_OR_DEPARTMENT_OTHER)
Admission: RE | Admit: 2010-07-12 | Discharge: 2010-07-12 | Disposition: A | Discharge status: Home / Self Care | Payer: Worker's Compensation | Source: Ambulatory Visit | Attending: Orthopedic Surgery | Admitting: Orthopedic Surgery

## 2010-07-12 LAB — BASIC METABOLIC PANEL
BUN: 11 mg/dL (ref 6–23)
Calcium: 9.1 mg/dL (ref 8.4–10.5)
Creatinine, Ser: 0.85 mg/dL (ref 0.50–1.35)
GFR calc Af Amer: >60 mL/min (ref >60)
GFR calc non Af Amer: >60 mL/min (ref >60)
Glucose, Bld: 170 mg/dL — ABNORMAL HIGH (ref 70–99)

## 2010-07-15 ENCOUNTER — Ambulatory Visit (HOSPITAL_BASED_OUTPATIENT_CLINIC_OR_DEPARTMENT_OTHER)
Admission: RE | Admit: 2010-07-15 | Discharge: 2010-07-15 | Disposition: A | Discharge status: Home / Self Care | Payer: Worker's Compensation | Source: Ambulatory Visit | Attending: Orthopedic Surgery | Admitting: Orthopedic Surgery

## 2010-07-15 DIAGNOSIS — Z01810 Encounter for preprocedural cardiovascular examination: Secondary | ICD-10-CM | POA: Insufficient documentation

## 2010-07-15 DIAGNOSIS — I2510 Atherosclerotic heart disease of native coronary artery without angina pectoris: Secondary | ICD-10-CM | POA: Insufficient documentation

## 2010-07-15 DIAGNOSIS — E669 Obesity, unspecified: Secondary | ICD-10-CM | POA: Insufficient documentation

## 2010-07-15 DIAGNOSIS — M67919 Unspecified disorder of synovium and tendon, unspecified shoulder: Secondary | ICD-10-CM | POA: Insufficient documentation

## 2010-07-15 DIAGNOSIS — Z01812 Encounter for preprocedural laboratory examination: Secondary | ICD-10-CM | POA: Insufficient documentation

## 2010-07-15 DIAGNOSIS — I10 Essential (primary) hypertension: Secondary | ICD-10-CM | POA: Insufficient documentation

## 2010-07-15 DIAGNOSIS — M24119 Other articular cartilage disorders, unspecified shoulder: Secondary | ICD-10-CM | POA: Insufficient documentation

## 2010-07-15 DIAGNOSIS — M719 Bursopathy, unspecified: Secondary | ICD-10-CM | POA: Insufficient documentation

## 2010-07-22 NOTE — Op Note (Signed)
James Stout, HASZ NO.:  [id number]  MEDICAL RECORD NO.:  [id number]  LOCATION:                                 FACILITY:  PHYSICIAN:  Loreta Ave, M.D.      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  Previous acromioplasty, decompression, and repair of the tear of the infra and supraspinatus tendons.  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  Previous acromioplasty, decompression, and repair of the tear of the infra and supraspinatus tendons with evidence of failure of the supraspinatus repair as a result of the tissue quality of the tendon.  The anchors and sutures were still in place, they are just pulled out from the cuff in the supraspinatus, which had not healed. The infraspinatus healed.  There was also complex tearing posterior as well as superior labrum.  PROCEDURES:  Left shoulder exam under anesthesia, arthroscopy, debridement of labrum.  Debridement and mobilization of cuff.  Repeat repair with fiber weave suture x3, swivel lock anchors x3.  SURGEON:  Loreta Ave, MD  ASSISTANT:  Genene Churn. Barry Dienes, [place], present throughout the entire case necessary for timely completion of procedure.  ANESTHESIA:  General.  BLOOD LOSS:  Minimal.  SPECIMENS:  None.  CULTURES:  None.  COMPLICATIONS:  None.  DRESSING:  Soft compressive shoulder immobilizer.  PROCEDURE IN DETAIL:  The patient was brought to the operating room, placed on the operating table in supine position.  After adequate anesthesia had been obtained, shoulder examined.  Full motion stable shoulder.  Placed in beach-chair position on a shoulder positioner, prepped and draped in usual sterile fashion.  Three portals, anterior, posterior, and lateral.  Arthroscope  introduced.  The shoulder distended and inspected.  Some complex tearing of the labral on the top from the little of the back all debrided.  Biceps tendon and biceps anchor intact.  Infraspinatus had healed, but the supraspinatus had just been thin, torn away from the sutures throughout the anterior half and extending a little bit further back to that.  This was debrided from above and below.  Although the tendon had pulled out, this still appeared to be relatively reasonable tissue quality to attempt debris repair.  After mobilizing the cuff for recurrent repair, I looked above and below.  Tuberosity was roughened.  Remaining exposed sutures were removed.  A bur was used to roughened tuberosity of the bleeding bone. Adequacy of previous decompression and distal clavicle excision confirmed.  A fourth posterolateral portal was utilized.  The cuff was captured with three horizontal mattress sutures.  A two outwards and one much more medial.  I brought the sutures out sequentially, taking the front suture and the anterior limb of the middle one anchoring it down into a new swivel lock anchor into the roughened tuberosity.  I then did the same with the posterior and the posterior limb of the middle suture and down in the back.  This gave me a nice firm repair of the cuff without undue tension  even through full passive motion.  Every effort was made to try to freshen up whole site to try to get this to heal.  I was pleased with the overall constructing repair and completion, visualizing this from above and below.  Instruments and fluid removed. Portals closed with nylon.  Sterile compressive dressing applied. Shoulder mobilizer and applied.  Anesthesia reversed.  Brought to the recovery room.  Tolerated surgery well.  No complications.     Loreta Ave, M.D.     DFM/MEDQ  D:  07/15/2010  T:  07/16/2010  Job:  [id number]  Electronically Signed by Mckinley Jewel M.D. on 07/22/2010  02:19:24 PM

## 2010-09-04 ENCOUNTER — Observation Stay (HOSPITAL_COMMUNITY)
Admission: EM | Admit: 2010-09-04 | Discharge: 2010-09-04 | Disposition: A | Discharge status: Home / Self Care | Payer: Medicaid Other | Attending: Emergency Medicine | Admitting: Emergency Medicine

## 2010-09-04 DIAGNOSIS — T7840XA Allergy, unspecified, initial encounter: Principal | ICD-10-CM | POA: Insufficient documentation

## 2010-09-04 DIAGNOSIS — R6889 Other general symptoms and signs: Secondary | ICD-10-CM | POA: Insufficient documentation

## 2010-09-04 LAB — POCT I-STAT, CHEM 8
Calcium, Ion: 1.14 mmol/L (ref 1.12–1.32)
Creatinine, Ser: 1.1 mg/dL (ref 0.50–1.35)
Glucose, Bld: 153 mg/dL — ABNORMAL HIGH (ref 70–99)
HCT: 52 % (ref 39.0–52.0)
Hemoglobin: 17.7 g/dL — ABNORMAL HIGH (ref 13.0–17.0)
TCO2: 22 mmol/L (ref 0–100)

## 2010-09-04 LAB — TROPONIN I: Troponin I: <0.3 ng/mL (ref <0.30)

## 2010-09-04 LAB — CK TOTAL AND CKMB (NOT AT ARMC)
CK, MB: 3.8 ng/mL (ref 0.3–4.0)
Relative Index: 3.4 — ABNORMAL HIGH (ref 0.0–2.5)
Total CK: 111 U/L (ref 7–232)

## 2010-09-04 LAB — POCT I-STAT TROPONIN I: Troponin i, poc: 0.16 ng/mL (ref 0.00–0.08)

## 2011-06-07 ENCOUNTER — Encounter: Payer: Self-pay | Admitting: *Deleted

## 2011-10-13 ENCOUNTER — Encounter (HOSPITAL_COMMUNITY): Payer: Self-pay | Admitting: Emergency Medicine

## 2011-10-13 ENCOUNTER — Emergency Department (HOSPITAL_COMMUNITY): Payer: Self-pay

## 2011-10-13 ENCOUNTER — Emergency Department (HOSPITAL_COMMUNITY)
Admission: EM | Admit: 2011-10-13 | Discharge: 2011-10-13 | Disposition: A | Discharge status: Home / Self Care | Payer: Self-pay | Attending: Emergency Medicine | Admitting: Emergency Medicine

## 2011-10-13 DIAGNOSIS — S93409A Sprain of unspecified ligament of unspecified ankle, initial encounter: Secondary | ICD-10-CM | POA: Insufficient documentation

## 2011-10-13 DIAGNOSIS — F411 Generalized anxiety disorder: Secondary | ICD-10-CM | POA: Insufficient documentation

## 2011-10-13 DIAGNOSIS — G4733 Obstructive sleep apnea (adult) (pediatric): Secondary | ICD-10-CM | POA: Insufficient documentation

## 2011-10-13 DIAGNOSIS — E785 Hyperlipidemia, unspecified: Secondary | ICD-10-CM | POA: Insufficient documentation

## 2011-10-13 DIAGNOSIS — M25579 Pain in unspecified ankle and joints of unspecified foot: Secondary | ICD-10-CM | POA: Insufficient documentation

## 2011-10-13 DIAGNOSIS — Z79899 Other long term (current) drug therapy: Secondary | ICD-10-CM | POA: Insufficient documentation

## 2011-10-13 DIAGNOSIS — I10 Essential (primary) hypertension: Secondary | ICD-10-CM | POA: Insufficient documentation

## 2011-10-13 DIAGNOSIS — X500XXA Overexertion from strenuous movement or load, initial encounter: Secondary | ICD-10-CM | POA: Insufficient documentation

## 2011-10-13 MED ORDER — OXYCODONE-ACETAMINOPHEN 5-325 MG PO TABS: 1.0000 | ORAL_TABLET | Freq: Four times a day (QID) | ORAL | Status: AC | PRN

## 2011-10-13 NOTE — Progress Notes (Signed)
Orthopedic Tech Progress Note Patient Details:  James Stout Dec 12, 1970 [id number]  Ortho Devices Type of Ortho Device: ASO Ortho Device/Splint Location: right ankle Ortho Device/Splint Interventions: Application   [name] 10/13/2011, 3:07 PM

## 2011-10-13 NOTE — ED Provider Notes (Signed)
History  Scribed for [employer]. Rubin Payor, MD, the patient was seen in room TR08C/TR08C. This chart was scribed by Candelaria Stagers. The patient's care started at 3:16 PM   CSN: [id number]  Arrival date & time 10/13/11  1359   First MD Initiated Contact with Patient 10/13/11 1420      Chief Complaint  Patient presents with  . Ankle Injury    Right ankle   The history is provided by the patient. No language interpreter was used.   James Stout is a 41 y.o. male who presents to the Emergency Department complaining of right ankle pain after stepping in a hole earlier today and rolling the ankle inward.  Pt denies hitting his head and landed on his elbows.  He has no other injuries.  Nothing seems to make the pain better or worse.  Past Medical History  Diagnosis Date  . Hypertension   . Anxiety   . Obesity   . Obstructive sleep apnea   . Dyslipidemia   . Chest pain     Past Surgical History  Procedure Date  . Right shoulder bicep tendon repair x 2   . Neck fusion surgery x 3   . Gallbladder surgery   . Nm myoview ltd 07/09/2009    normal/ no ischemia  . Cardiac catheterization 08/26/2005    moderate nonobstructive proximal RCA disease with 40-50 % proximal stenosis/catheter induced spasm noted/ LVF- 55-60%  . Shoulder surgery left    No family history on file.  History  Substance Use Topics  . Smoking status: Never Smoker   . Smokeless tobacco: Not on file  . Alcohol Use: Yes     OCC      Review of Systems  Musculoskeletal: Positive for arthralgias (right ankle pain).  All other systems reviewed and are negative.    Allergies  Review of patient's allergies indicates no known allergies.  Home Medications   Current Outpatient Rx  Name Route Sig Dispense Refill  . CARVEDILOL 6.25 MG PO TABS Oral Take 6.25 mg by mouth 2 (two) times daily with a meal.    . CITALOPRAM HYDROBROMIDE 40 MG PO TABS Oral Take 40 mg by mouth at bedtime.    Marland Kitchen ZOLPIDEM TARTRATE 10  MG PO TABS Oral Take 10 mg by mouth at bedtime as needed. For insomnia.    Marland Kitchen NITROGLYCERIN 0.4 MG SL SUBL Sublingual Place 0.4 mg under the tongue every 5 (five) minutes as needed. For chest pain.    . OXYCODONE-ACETAMINOPHEN 5-325 MG PO TABS Oral Take 1-2 tablets by mouth every 6 (six) hours as needed for pain. 10 tablet 0    BP 142/94  Pulse 82  Temp 98 F (36.7 C) (Oral)  Resp 16  SpO2 96%  Physical Exam  Nursing note and vitals reviewed. Constitutional: He is oriented to person, place, and time. He appears well-developed and well-nourished. No distress.  HENT:  Head: Normocephalic and atraumatic.  Eyes: EOM are normal. Pupils are equal, round, and reactive to light.  Neck: Neck supple. No tracheal deviation present.  Pulmonary/Chest: Effort normal. No respiratory distress.  Musculoskeletal:       Right Ankle: Tenderness over the lateral malleolus.  No tenderness at the base of the 5th metatarsal.  Tenderness over the medial malleolus.  Mild Swelling.  Neurovascularly intact.  Able to wiggle toes.      Neurological: He is alert and oriented to person, place, and time. No sensory deficit.  Skin: Skin is warm and  dry.  Psychiatric: He has a normal mood and affect. His behavior is normal.    ED Course  Procedures   COORDINATION OF CARE:  14:08 Ordered: DG Ankle Complete Right 14:48 Ordered: Apply ASO ankle   Labs Reviewed - No data to display Dg Ankle Complete Right  10/13/2011  *RADIOLOGY REPORT*  Clinical Data: Right ankle injury.  Pain and swelling along the lateral aspect.  RIGHT ANKLE - COMPLETE 3+ VIEW  Comparison: None.  Findings: Extensive soft tissue swelling is present over the lateral malleolus.  The ankle is located.  No acute osseous abnormality is present.  Calcaneal spurs are evident.  IMPRESSION: Extensive soft tissue swelling over lateral malleolus without an underlying fracture.  Ligamentous injury is not excluded.   Original Report Authenticated By:  Jamesetta Orleans. MATTERN, M.D.      1. Ankle sprain       MDM  Patient with right lateral ankle injury. No tenderness over fibula. X-ray does not show fracture. Patient was given an ASO and will followup with Ortho as  I personally performed the services described in this documentation, which was scribed in my presence. The recorded information has been reviewed and considered.         Juliet Rude. Rubin Payor, MD 10/13/11 1517

## 2011-10-13 NOTE — ED Notes (Signed)
Pt reports stepped into a hole and twisted right ankle. Right ankle swollen and bruised at present. Pulses present. Pt can wiggle toes.

## 2013-05-03 ENCOUNTER — Emergency Department (HOSPITAL_COMMUNITY): Payer: Medicaid Other

## 2013-05-03 ENCOUNTER — Emergency Department (HOSPITAL_COMMUNITY)
Admission: EM | Admit: 2013-05-03 | Discharge: 2013-05-03 | Disposition: A | Discharge status: Home / Self Care | Payer: Medicaid Other | Attending: Emergency Medicine | Admitting: Emergency Medicine

## 2013-05-03 ENCOUNTER — Encounter (HOSPITAL_COMMUNITY): Payer: Self-pay | Admitting: Emergency Medicine

## 2013-05-03 DIAGNOSIS — S43005A Unspecified dislocation of left shoulder joint, initial encounter: Secondary | ICD-10-CM

## 2013-05-03 DIAGNOSIS — Z79899 Other long term (current) drug therapy: Secondary | ICD-10-CM | POA: Insufficient documentation

## 2013-05-03 DIAGNOSIS — Z9889 Other specified postprocedural states: Secondary | ICD-10-CM | POA: Insufficient documentation

## 2013-05-03 DIAGNOSIS — W28XXXA Contact with powered lawn mower, initial encounter: Secondary | ICD-10-CM | POA: Insufficient documentation

## 2013-05-03 DIAGNOSIS — S43004A Unspecified dislocation of right shoulder joint, initial encounter: Secondary | ICD-10-CM

## 2013-05-03 DIAGNOSIS — Y929 Unspecified place or not applicable: Secondary | ICD-10-CM | POA: Insufficient documentation

## 2013-05-03 DIAGNOSIS — R296 Repeated falls: Secondary | ICD-10-CM | POA: Insufficient documentation

## 2013-05-03 DIAGNOSIS — I10 Essential (primary) hypertension: Secondary | ICD-10-CM | POA: Insufficient documentation

## 2013-05-03 DIAGNOSIS — F411 Generalized anxiety disorder: Secondary | ICD-10-CM | POA: Insufficient documentation

## 2013-05-03 DIAGNOSIS — Y9389 Activity, other specified: Secondary | ICD-10-CM | POA: Insufficient documentation

## 2013-05-03 DIAGNOSIS — S43006A Unspecified dislocation of unspecified shoulder joint, initial encounter: Secondary | ICD-10-CM | POA: Insufficient documentation

## 2013-05-03 DIAGNOSIS — E669 Obesity, unspecified: Secondary | ICD-10-CM | POA: Insufficient documentation

## 2013-05-03 DIAGNOSIS — R61 Generalized hyperhidrosis: Secondary | ICD-10-CM | POA: Insufficient documentation

## 2013-05-03 IMAGING — CR DG HUMERUS 2V *L*
2 series · 2 of 2 positions shown · non-contrast
Comparison: None.

CLINICAL DATA: SHOULDER INJURY

EXAM:
LEFT HUMERUS - 2+ VIEW

[x humerus ap left]
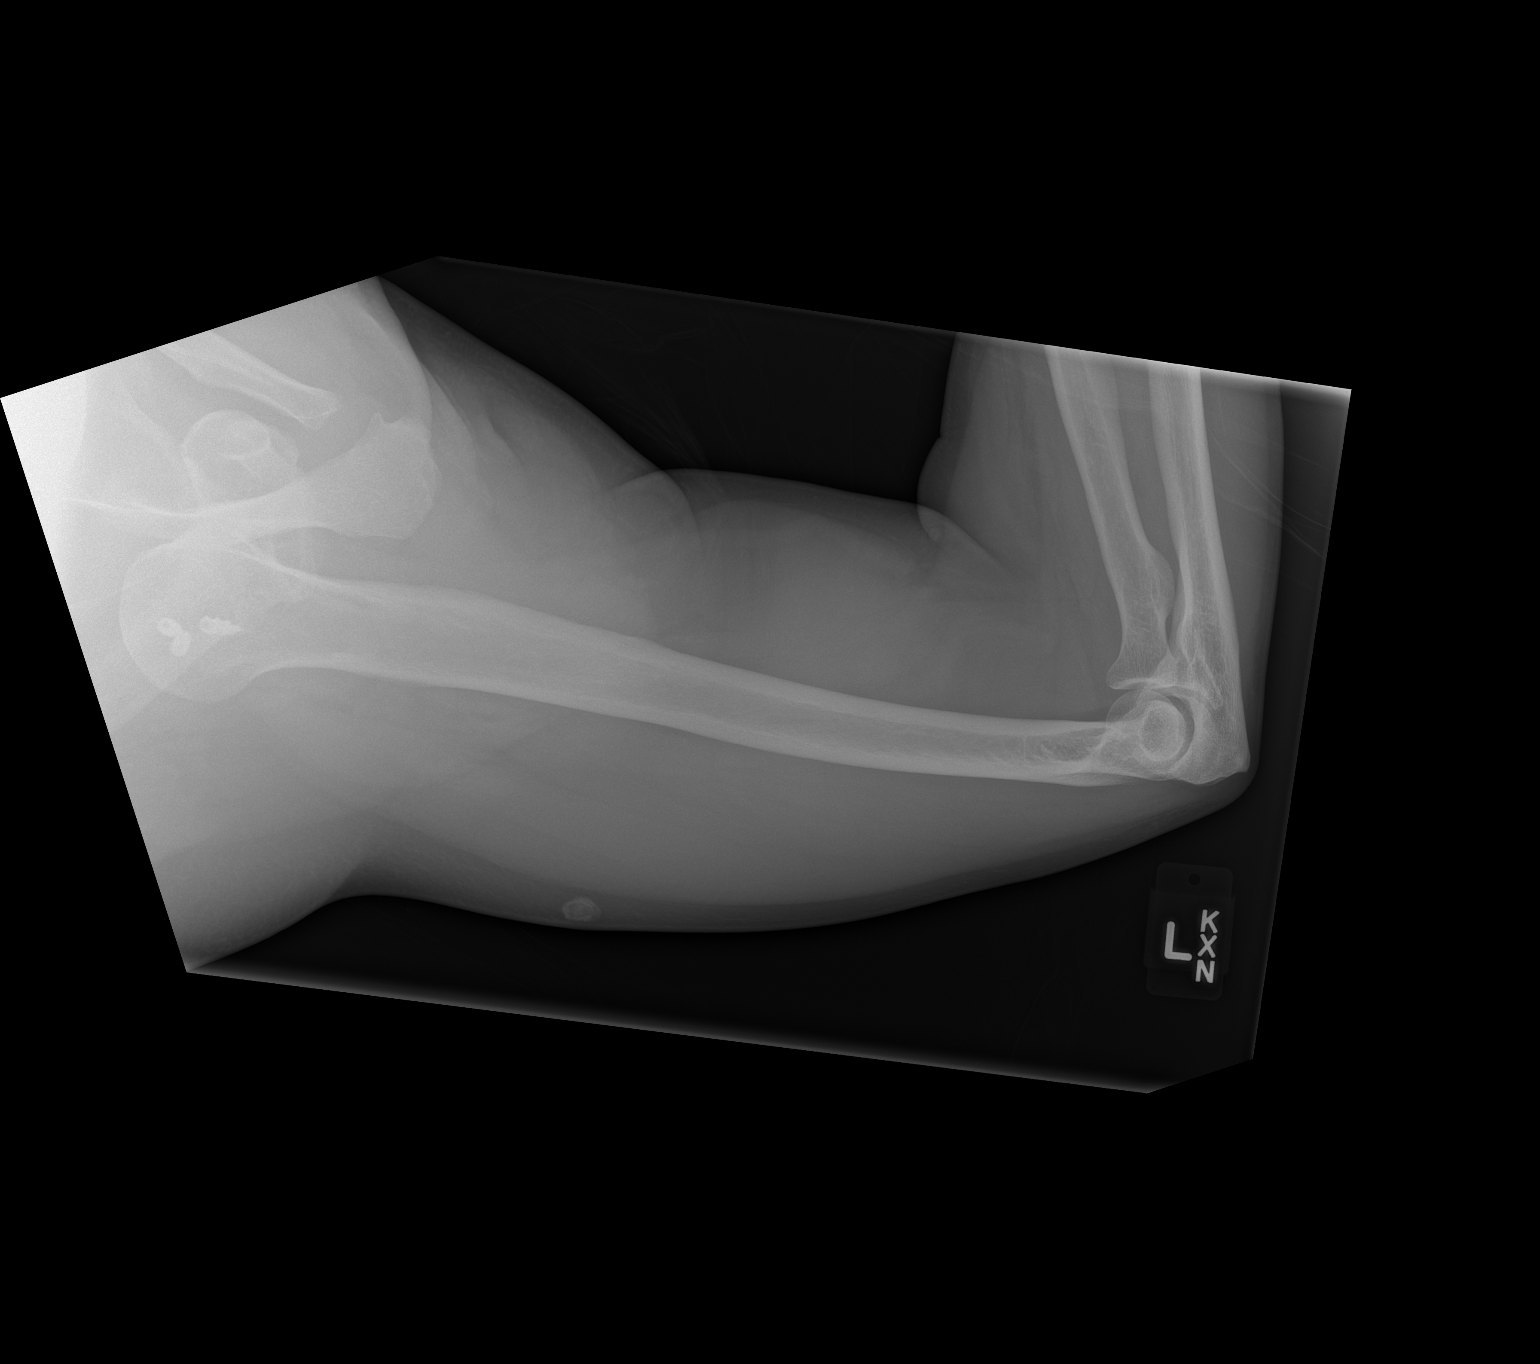

[x humerus lat left]
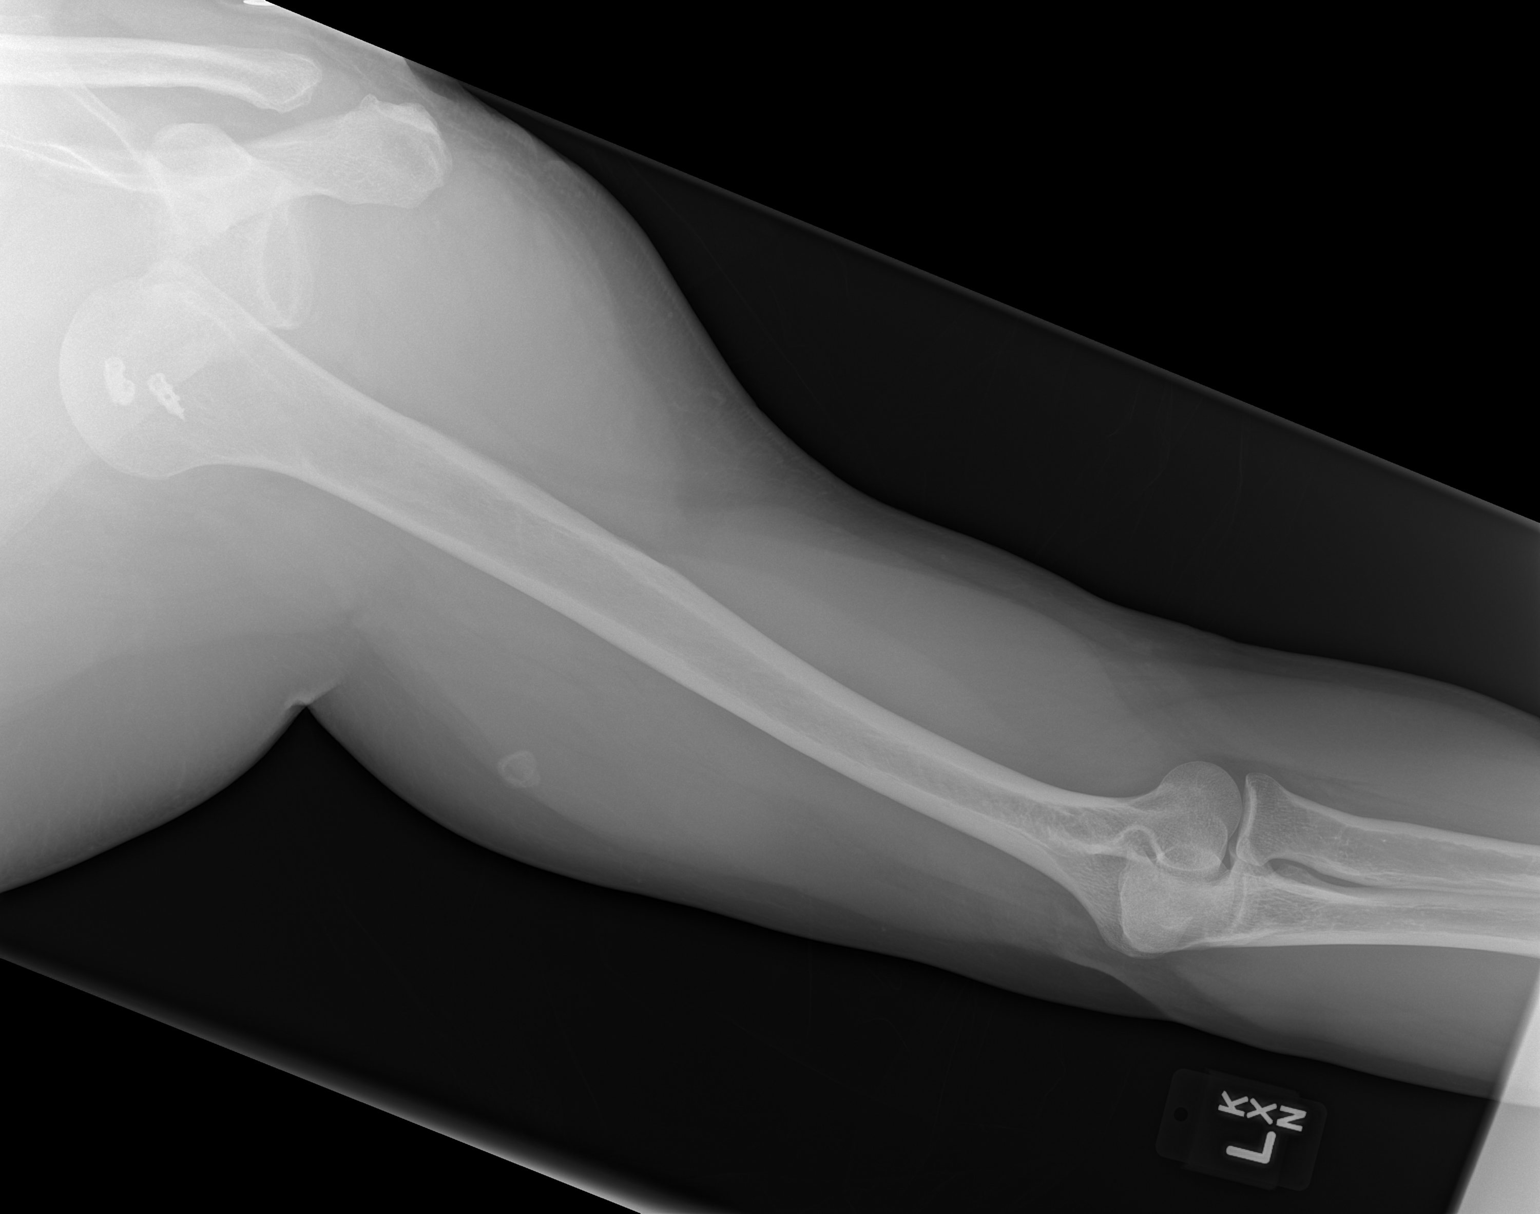

[2 of 2 positions shown; findings below may reference images not displayed]

FINDINGS: There is no evidence of fracture or other focal bone lesions. Soft
tissues are unremarkable. Postsurgical changes in humeral head.
There is inferior medial dislocation of the humeral head.
IMPRESSION: Dislocation of the humeral head no evidence of acute fracture.

## 2013-05-03 MED ORDER — PROPOFOL 10 MG/ML IV BOLUS
INTRAVENOUS | Status: AC
  Filled 2013-05-03: qty 20

## 2013-05-03 MED ORDER — HYDROMORPHONE HCL PF 1 MG/ML IJ SOLN
1.0000 mg | Freq: Once | INTRAMUSCULAR | Status: DC
  Filled 2013-05-03: qty 1

## 2013-05-03 MED ORDER — HYDROMORPHONE HCL PF 1 MG/ML IJ SOLN
1.0000 mg | Freq: Once | INTRAMUSCULAR | Status: AC
  Administered 2013-05-03: 1 mg via INTRAVENOUS
  Filled 2013-05-03: qty 1

## 2013-05-03 MED ORDER — HYDROMORPHONE HCL PF 1 MG/ML IJ SOLN
1.0000 mg | Freq: Once | INTRAMUSCULAR | Status: AC
  Administered 2013-05-03: 1 mg via INTRAVENOUS

## 2013-05-03 MED ORDER — OXYCODONE-ACETAMINOPHEN 5-325 MG PO TABS: 1.0000 | ORAL_TABLET | Freq: Four times a day (QID) | ORAL | Status: AC | PRN

## 2013-05-03 MED ORDER — ONDANSETRON HCL 4 MG/2ML IJ SOLN
4.0000 mg | Freq: Once | INTRAMUSCULAR | Status: AC
  Administered 2013-05-03: 4 mg via INTRAVENOUS
  Filled 2013-05-03: qty 2

## 2013-05-03 MED ORDER — PROPOFOL 10 MG/ML IV BOLUS
INTRAVENOUS | Status: AC | PRN
  Administered 2013-05-03 (×3): 60 mg via INTRAVENOUS

## 2013-05-03 MED ORDER — PROPOFOL 10 MG/ML IV BOLUS
1.0000 mg/kg | Freq: Once | INTRAVENOUS | Status: AC
  Administered 2013-05-03: 65 mg via INTRAVENOUS
  Filled 2013-05-03: qty 20

## 2013-05-03 NOTE — Discharge Instructions (Signed)
Return to the ED with any concerns including weakness of arms, numbness of arms, pain not controlled by pain medication, decreased level of alertness/lethargy, or any other alarming symptoms

## 2013-05-03 NOTE — Sedation Documentation (Signed)
Another 60mg  Propofol given by Ali LoweJenna Gage, RN

## 2013-05-03 NOTE — ED Notes (Signed)
Dr. Karma GanjaLinker at the bedside, Ambu bag and suction set up at the bedside.

## 2013-05-03 NOTE — ED Notes (Signed)
Pt kicking legs and screaming. Family at bedside. Md Plunkett at bedside explaining to pt why she cannot give him more pain medication at this time.

## 2013-05-03 NOTE — Sedation Documentation (Signed)
Bilateral arms placed in shoulder immobilizers.

## 2013-05-03 NOTE — ED Notes (Signed)
Per EMS: Pt pushing riding lawnmower onto trailer, when mower rolled back, pushing pt face down with arms extended above head. Pt denies LOC. Reports bilateral shoulder pain, neck pain, numbness/tingling to bilateral arms, pulses intact bilaterally. AO x4. PT given fentanyl relieving pain from 10/10 to 8/10. Placed in C collar, bilateral shoulder slings. 161/117. 70 bpm. 22 RR. 96 RA.
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
## 2013-05-03 NOTE — ED Provider Notes (Signed)
CSN: [id number]     Arrival date & time 05/03/13  1427 History   First MD Initiated Contact with Patient 05/03/13 1443     Chief Complaint  Patient presents with  . Shoulder Injury     (Consider location/radiation/quality/duration/timing/severity/associated sxs/prior Treatment) Patient is a 43 y.o. male presenting with shoulder injury. The history is provided by the patient.  Shoulder Injury This is a new (pt was unloading a lawnmower and fell forward and lawnmower rolled onto him.) problem. The current episode started 1 to 2 hours ago. The problem occurs constantly. The problem has not changed since onset.Pertinent negatives include no chest pain, no abdominal pain, no headaches and no shortness of breath. Associated symptoms comments: Right and left shoulder pain and left upper arm pain with inability to move the left upper arm.  Also neck pain since the fall.. The symptoms are aggravated by bending. Nothing relieves the symptoms. Treatments tried: sling, rest and fentanyl by EMS. The treatment provided no relief.    Past Medical History  Diagnosis Date  . Hypertension   . Anxiety   . Obesity   . Obstructive sleep apnea   . Dyslipidemia   . Chest pain    Past Surgical History  Procedure Laterality Date  . Right shoulder bicep tendon repair x 2    . Neck fusion surgery x 3    . Gallbladder surgery    . Nm myoview ltd  07/09/2009    normal/ no ischemia  . 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 Shoulder surgery  left   History reviewed. No pertinent family history. History  Substance Use Topics  . Smoking status: Never Smoker   . Smokeless tobacco: Not on file  . Alcohol Use: Yes     Comment: OCC    Review of Systems  Constitutional: Positive for diaphoresis.  Respiratory: Negative for shortness of breath.   Cardiovascular: Negative for chest pain.    Gastrointestinal: Negative for abdominal pain.  Neurological: Positive for numbness. Negative for headaches.       Numbness and tingling in the left upper ext  All other systems reviewed and are negative.     Allergies  Review of patient's allergies indicates no known allergies.  Home Medications   Current Outpatient Rx  Name  Route  Sig  Dispense  Refill  . carvedilol (COREG) 6.25 MG tablet   Oral   Take 6.25 mg by mouth 2 (two) times daily with a meal.         . citalopram (CELEXA) 40 MG tablet   Oral   Take 40 mg by mouth at bedtime.         . nitroGLYCERIN (NITROSTAT) 0.4 MG SL tablet   Sublingual   Place 0.4 mg under the tongue every 5 (five) minutes as needed. For chest pain.         Marland Kitchen. oxyCODONE-acetaminophen (PERCOCET/ROXICET) 5-325 MG per tablet   Oral   Take 1-2 tablets by mouth every 6 (six) hours as needed for pain.   10 tablet   0   . zolpidem (AMBIEN) 10 MG tablet   Oral   Take 10 mg by mouth at bedtime as needed. For insomnia.          Pulse 72  Resp 20  Ht 5\' 10"  (1.778 m)  Wt 275 lb (124.739 kg)  BMI 39.46 kg/m2  SpO2 96% Physical  Exam  Nursing note and vitals reviewed. Constitutional: He is oriented to person, place, and time. He appears well-developed and well-nourished. He appears distressed.  Appears in pain  HENT:  Head: Normocephalic and atraumatic.  Mouth/Throat: Oropharynx is clear and moist.  Eyes: Conjunctivae and EOM are normal. Pupils are equal, round, and reactive to light.  Neck: Normal range of motion. Neck supple. Spinous process tenderness and muscular tenderness present.    Cardiovascular: Normal rate, regular rhythm and intact distal pulses.   No murmur heard. Pulmonary/Chest: Effort normal and breath sounds normal. No respiratory distress. He has no wheezes. He has no rales. He exhibits no tenderness.  Abdominal: Soft. He exhibits no distension. There is no tenderness. There is no rebound and no guarding.   Musculoskeletal: He exhibits no edema.       Right shoulder: He exhibits decreased range of motion, tenderness and bony tenderness. He exhibits no deformity, normal pulse and normal strength.       Left shoulder: He exhibits decreased range of motion, tenderness and deformity. He exhibits normal pulse and normal strength.       Left upper arm: He exhibits tenderness and bony tenderness.  Most prominent pain over the proximal humerus  Neurological: He is alert and oriented to person, place, and time.  Skin: Skin is warm. No rash noted. He is diaphoretic. No erythema.  Psychiatric: He has a normal mood and affect. His behavior is normal.    ED Course  Procedures (including critical care time) Labs Review Labs Reviewed - No data to display Imaging Review No results found.   EKG Interpretation None      MDM   Final diagnoses:  None    Patient fell today while trying to unload a lawnmower and hit rolled back on him.  Since that time he has had severe right shoulder, left shoulder/arm pain and neck pain.  N/V intact but c/o of numbness in the left upper ext.  2+ radial pulses bilaterally.  Central neck tenderness on exam but no torso or lower ext deficits or pain.  Pt denies head injury or LOC.  Received 450 of fentanyl in route with some improvement in pain.  Pt has hx of multiple shoulder surgeries for rotator cuffs bilaterally.  Mild deformity of the left upper ext and decreased ROM.  Concern for dislocation vs fx.  CT c-spine, and bilateral shoulder and left humerus film pending.  Pt checked out to Dr. Karma Ganja at 1600.  Gwyneth Sprout, MD 05/06/13 [phone]

## 2013-05-03 NOTE — ED Provider Notes (Addendum)
5:40 PM xrays show bilateral shoulder dislocation.  Also possible humerus fracture on right- ordered dedicated humerus film on right. Pt recheck and pain medicaiton is helping he is moderately comfortable at this time.  He has had multiple prior bilateral shoulder surgeries, 2 by Dr. Eulah PontMurphy and several [facility] [place] [place] [facility]  Will contact Dr. Eulah PontMurphy for consultation.    5:50 PM d/w Dr. Eulah PontMurphy, he has reviewed xrays, requests that I reduce both shoulders.  He states that the definitive humerus xray on right is not necessary and there is no fracture.  Have d/w nurse and will get procedural sedation set up.  I have discussed results and plan with patient.   Reduction of dislocation Date/Time: 7:06 PM Performed by: Ethelda ChickMartha K Linker Authorized by: Ethelda ChickMartha K Linker Consent: Verbal consent obtained. Risks and benefits: risks, benefits and alternatives were discussed Consent given by: patient Required items: required blood products, implants, devices, and special equipment available Time out: Immediately prior to procedure a "time out" was called to verify the correct patient, procedure, equipment, support staff and site/side marked as required.  Patient sedated: yes with propofol  Vitals: Vital signs were monitored during sedation. Patient tolerance: Patient tolerated the procedure well with no immediate complications. Joint: right shoulder Reduction technique: closed  Right shoulder reduced successfully with improvement in exam- xray ordered  Reduction of dislocation Date/Time: 7:07 PM Performed by: Ethelda ChickMartha K Linker Authorized by: Ethelda ChickMartha K Linker Consent: Verbal consent obtained. Risks and benefits: risks, benefits and alternatives were discussed Consent given by: patient Required items: required blood products, implants, devices, and special equipment available Time out: Immediately prior to procedure a "time out" was called to verify the correct patient, procedure, equipment, support  staff and site/side marked as required.  Patient sedated: yes with propofol  Vitals: Vital signs were monitored during sedation. Patient tolerance: Patient tolerated the procedure well with no immediate complications. Joint: left shoulder Reduction technique: closed  Left shoulder reduced, but immediately coming in and out of joint, unable to sustain reduction for more than one second.  Joint feels very lax.  Arm splinted and will obtain xray but I feel this joint may still be dislocated.  If this is the case on xray will re-consult orthopedics, Dr. Eulah PontMurphy.   Procedural sedation Performed by: Ethelda ChickMartha K Linker Consent: Verbal consent obtained. Risks and benefits: risks, benefits and alternatives were discussed Required items: required blood products, implants, devices, and special equipment available Patient identity confirmed: arm band and provided demographic data Time out: Immediately prior to procedure a "time out" was called to verify the correct patient, procedure, equipment, support staff and site/side marked as required.  Sedation type: moderate (conscious) sedation NPO time confirmed and considedered  Sedatives: PROPOFOL  Physician Time at Bedside: 30  Vitals: Vital signs were monitored during sedation. Cardiac Monitor, pulse oximeter Patient tolerance: Patient tolerated the procedure well with no immediate complications. Comments: Pt with uneventful recovered. Returned to pre-procedural sedation baseline  7:58 PM left shoulder remains displaced, possible Hill-Sachs fracture on xray.  Right shoulder reduced and in place on post reduction film.  D/w Dr. Eulah PontMurphy again- he will now see patient, requests to prepare patient for another sedation and to obtain C-arm.     Dr. Eulah PontMurphy has seen patient, we reduced left shoulder again under procedural sedation  Procedural sedation- 2nd sedation Performed by: Ethelda ChickMartha K Linker Consent: Verbal consent obtained. Risks and benefits: risks,  benefits and alternatives were discussed Required items: required blood products, implants, devices,  and special equipment available Patient identity confirmed: arm band and provided demographic data Time out: Immediately prior to procedure a "time out" was called to verify the correct patient, procedure, equipment, support staff and site/side marked as required.  Sedation type: moderate (conscious) sedation NPO time confirmed and considedered  Sedatives: PROPOFOL  Physician Time at Bedside: 35  Vitals: Vital signs were monitored during sedation. Cardiac Monitor, pulse oximeter Patient tolerance: Patient tolerated the procedure well with no immediate complications. Comments: Pt with uneventful recovered. Returned to pre-procedural sedation baseline  Pt advised by Dr. Eulah Pont to followup with his doctor at [facility] Dr. Carney Living.      Ethelda Chick, MD 05/03/13 1909  Ethelda Chick, MD 05/04/13 [phone]

## 2013-05-03 NOTE — ED Notes (Signed)
Ortho paged for bilateral shoulder imboilizers. Apologized to pt for delay. Pt continues to rest comfortably in bed. Rates pain 7/10. States "my right shoulder hurts more then my left."

## 2013-05-03 NOTE — ED Notes (Signed)
Contacted xray to see if pt's imagining could be expedited.

## 2013-07-31 ENCOUNTER — Ambulatory Visit (HOSPITAL_BASED_OUTPATIENT_CLINIC_OR_DEPARTMENT_OTHER): Payer: Worker's Compensation

## 2013-09-19 ENCOUNTER — Ambulatory Visit (HOSPITAL_BASED_OUTPATIENT_CLINIC_OR_DEPARTMENT_OTHER): Payer: Medicaid Other | Attending: Physical Medicine and Rehabilitation

## 2013-10-25 ENCOUNTER — Other Ambulatory Visit (HOSPITAL_COMMUNITY): Payer: Self-pay | Admitting: Orthopedic Surgery

## 2013-10-25 DIAGNOSIS — M25511 Pain in right shoulder: Secondary | ICD-10-CM

## 2013-11-06 ENCOUNTER — Ambulatory Visit (HOSPITAL_COMMUNITY)
Admission: RE | Admit: 2013-11-06 | Discharge: 2013-11-06 | Disposition: A | Discharge status: Home / Self Care | Payer: Medicaid Other | Source: Ambulatory Visit | Attending: Orthopedic Surgery | Admitting: Orthopedic Surgery

## 2013-11-06 DIAGNOSIS — M25511 Pain in right shoulder: Secondary | ICD-10-CM | POA: Diagnosis present

## 2014-02-09 ENCOUNTER — Ambulatory Visit (HOSPITAL_BASED_OUTPATIENT_CLINIC_OR_DEPARTMENT_OTHER): Payer: Medicaid Other | Attending: Physical Medicine and Rehabilitation

## 2014-02-09 DIAGNOSIS — G4733 Obstructive sleep apnea (adult) (pediatric): Secondary | ICD-10-CM | POA: Diagnosis not present

## 2014-02-15 ENCOUNTER — Ambulatory Visit (HOSPITAL_BASED_OUTPATIENT_CLINIC_OR_DEPARTMENT_OTHER): Payer: Medicaid Other | Admitting: Internal Medicine

## 2014-02-15 DIAGNOSIS — G4733 Obstructive sleep apnea (adult) (pediatric): Secondary | ICD-10-CM

## 2014-02-15 NOTE — Sleep Study (Signed)
   NAME: James BruinsRobert E Stout DATE OF BIRTH:  07/31/1970 MEDICAL RECORD NUMBER [id number]  LOCATION: [facility]  PHYSICIAN: Joziyah Roblero D  DATE OF STUDY: 02/09/2014  SLEEP STUDY TYPE: Nocturnal Polysomnogram               REFERRING PHYSICIAN: Verdon CumminsBodea, Marioara, MD  INDICATION FOR STUDY: Hypersomnia with sleep apnea  EPWORTH SLEEPINESS SCORE:   9/24 HEIGHT:   5 feet 10 inches WEIGHT:   256 pounds  BMI 37 NECK SIZE:   18 in.  MEDICATIONS: Charted for review  SLEEP ARCHITECTURE: Total sleep time 234 minutes with sleep efficiency 64.9%. Stage I was 11.5%, stage II 88.5%, stages 3 and REM were absent. Sleep latency 36 minutes, awake after sleep onset 90.5 minutes, arousal index 24.4, bedtime medication: Celexa, zolpidem, morphine, atorvastatin, Carvedilol, cyclobenzaprine.  Sleep onset was delayed and sleep was fragmented by frequent spontaneous awakenings throughout the night.  RESPIRATORY DATA: Apnea hypopnea index (AHI) 8.7 per hour. 34 total events scored including 15 obstructive apneas, 1 mixed apnea, 18 hypopneas. All events were associated with nonsupine sleep position. There was no REM. There were not enough respiratory events or sleep time within the first hours to meet protocol requirements for split CPAP titration.  OXYGEN DATA: Moderately loud snoring with oxygen desaturation to a nadir of 84% and mean saturation 93.6% on room air.  CARDIAC DATA: Normal sinus rhythm  MOVEMENT/PARASOMNIA: 62 total limb jerks counted of which 26 were associated with arousal or awakening for a periodic limb movement with arousal index of 6.7 per hour. Bathroom trips.  IMPRESSION/ RECOMMENDATION:   1) Significant difficulty initiating and maintaining sleep on this study night despite the bedtime medications listed above. Sleep was obtained near midnight with frequent spontaneous awakenings throughout the study. Only some of this could be attributed to recognize sleep disturbance  former spar respiratory events or limb jerks. 2) Mild obstructive sleep apnea/hypopnea syndrome, AHI 8.7 per hour with nonsupine events. Moderately loud snoring with oxygen desaturation to a nadir of 84% and mean saturation 93.6% on room air. 3) Scores in this range may sometimes respond adequately to conservative management including weight loss and treatment for any significant nasal congestion or upper airway obstruction. On an individual basis, CPAP or an oral appliance may be appropriate.   Waymon BudgeYOUNG,Serrina Minogue D Diplomate, American Board of Sleep Medicine  ELECTRONICALLY SIGNED ON:  02/15/2014, 4:24 PM [facility] PH: [phone]   FX: [phone] ACCREDITED BY THE AMERICAN ACADEMY OF SLEEP MEDICINE

## 2014-04-24 ENCOUNTER — Institutional Professional Consult (permissible substitution): Payer: Worker's Compensation | Admitting: Internal Medicine

## 2020-02-18 ENCOUNTER — Inpatient Hospital Stay: Admit: 2020-02-18 | Payer: PRIVATE HEALTH INSURANCE | Primary: Family

## 2020-02-18 DIAGNOSIS — I10 Essential (primary) hypertension: Secondary | ICD-10-CM

## 2020-02-18 LAB — CBC WITH AUTOMATED DIFF
BASOPHILS: 0.7 % (ref 0–3)
EOSINOPHILS: 5.4 % — ABNORMAL HIGH (ref 0–5)
HCT: 46 % (ref 37.0–50.0)
HGB: 15.9 gm/dl (ref 12.4–17.2)
IMMATURE GRANULOCYTES: 0.2 % (ref 0.0–3.0)
LYMPHOCYTES: 29.7 % (ref 28–48)
MCH: 29.2 pg (ref 23.0–34.6)
MCHC: 34.6 gm/dl (ref 30.0–36.0)
MCV: 84.4 fL (ref 80.0–98.0)
MONOCYTES: 10 % (ref 1–13)
MPV: 11.2 fL — ABNORMAL HIGH (ref 6.0–10.0)
NEUTROPHILS: 54 % (ref 34–64)
NRBC: 0 (ref 0–0)
PLATELET: 281 10*3/uL (ref 140–450)
RBC: 5.45 M/uL (ref 3.80–5.70)
RDW-SD: 43.1 (ref 35.1–43.9)
WBC: 4.6 10*3/uL (ref 4.0–11.0)

## 2020-02-18 LAB — METABOLIC PANEL, COMPREHENSIVE
ALT (SGPT): 31 U/L (ref 12–78)
AST (SGOT): 17 U/L (ref 15–37)
Albumin: 3.8 gm/dl (ref 3.4–5.0)
Alk. phosphatase: 78 U/L (ref 45–117)
Anion gap: 6 mmol/L (ref 5–15)
BUN: 14 mg/dl (ref 7–25)
Bilirubin, total: 0.6 mg/dl (ref 0.2–1.0)
CO2: 26 mEq/L (ref 21–32)
Calcium: 9.4 mg/dl (ref 8.5–10.1)
Chloride: 107 mEq/L (ref 98–107)
Creatinine: 0.9 mg/dl (ref 0.6–1.3)
GFR est AA: >60
GFR est non-AA: >60
Glucose: 130 mg/dl — ABNORMAL HIGH (ref 74–106)
Potassium: 4.1 mEq/L (ref 3.5–5.1)
Protein, total: 7.2 gm/dl (ref 6.4–8.2)
Sodium: 139 mEq/L (ref 136–145)

## 2020-02-18 LAB — CBC WITH AUTO DIFFERENTIAL
Basophils %: 0.7 % (ref 0–3)
Eosinophils %: 5.4 % — ABNORMAL HIGH (ref 0–5)
Hematocrit: 46 % (ref 37.0–50.0)
Hemoglobin: 15.9 gm/dl (ref 12.4–17.2)
Immature Granulocytes: 0.2 % (ref 0.0–3.0)
Lymphocytes %: 29.7 % (ref 28–48)
MCH: 29.2 pg (ref 23.0–34.6)
MCHC: 34.6 gm/dl (ref 30.0–36.0)
MCV: 84.4 fL (ref 80.0–98.0)
MPV: 11.2 fL — ABNORMAL HIGH (ref 6.0–10.0)
Monocytes %: 10 % (ref 1–13)
Neutrophils %: 54 % (ref 34–64)
Nucleated RBCs: 0 (ref 0–0)
Platelets: 281 10*3/uL (ref 140–450)
RBC: 5.45 M/uL (ref 3.80–5.70)
RDW-SD: 43.1 (ref 35.1–43.9)
WBC: 4.6 10*3/uL (ref 4.0–11.0)

## 2020-02-18 LAB — COMPREHENSIVE METABOLIC PANEL
ALT: 31 U/L (ref 12–78)
AST: 17 U/L (ref 15–37)
Albumin: 3.8 gm/dl (ref 3.4–5.0)
Alkaline Phosphatase: 78 U/L (ref 45–117)
Anion Gap: 6 mmol/L (ref 5–15)
BUN: 14 mg/dl (ref 7–25)
CO2: 26 mEq/L (ref 21–32)
Calcium: 9.4 mg/dl (ref 8.5–10.1)
Chloride: 107 mEq/L (ref 98–107)
Creatinine: 0.9 mg/dl (ref 0.6–1.3)
EGFR IF NonAfrican American: >60
GFR African American: >60
Glucose: 130 mg/dl — ABNORMAL HIGH (ref 74–106)
Potassium: 4.1 mEq/L (ref 3.5–5.1)
Sodium: 139 mEq/L (ref 136–145)
Total Bilirubin: 0.6 mg/dl (ref 0.2–1.0)
Total Protein: 7.2 gm/dl (ref 6.4–8.2)

## 2020-02-18 LAB — PSA SCREENING: Pros. Spec. Antigen: 0.51 ng/ml (ref 0.00–4.00)

## 2020-02-18 LAB — LIPID PANEL
CHOL/HDL Ratio: 6.1 Ratio — ABNORMAL HIGH (ref 0.0–5.0)
Chol/HDL Ratio: 6.1 Ratio — ABNORMAL HIGH (ref 0.0–5.0)
Cholesterol, Total: 249 mg/dl — ABNORMAL HIGH (ref 140–199)
Cholesterol, total: 249 mg/dl — ABNORMAL HIGH (ref 140–199)
HDL Cholesterol: 41 mg/dl (ref 40–96)
HDL: 41 mg/dl (ref 40–96)
LDL Calculated: 161 mg/dl — ABNORMAL HIGH (ref 0–130)
LDL, calculated: 161 mg/dl — ABNORMAL HIGH (ref 0–130)
Triglyceride: 233 mg/dl — ABNORMAL HIGH (ref 29–150)
Triglycerides: 233 mg/dl — ABNORMAL HIGH (ref 29–150)

## 2020-02-18 LAB — PSA SCREENING (SCREENING): Prostate Specific Ag: 0.51 ng/ml (ref 0.00–4.00)

## 2020-02-19 LAB — HEMOGLOBIN A1C W/O EAG
Hemoglobin A1C: 5.5 % (ref 4.2–5.6)
Hemoglobin A1c: 5.5 % (ref 4.2–5.6)

## 2020-03-10 ENCOUNTER — Ambulatory Visit: Attending: Urology | Primary: Family

## 2020-03-10 ENCOUNTER — Ambulatory Visit: Admit: 2020-03-10 | Discharge: 2020-03-10 | Attending: Urology | Primary: Family

## 2020-03-10 DIAGNOSIS — R3914 Feeling of incomplete bladder emptying: Secondary | ICD-10-CM

## 2020-03-10 LAB — AMB POC URINALYSIS DIP STICK AUTO W/O MICRO
Bilirubin (UA POC): NEGATIVE
Bilirubin, Urine, POC: NEGATIVE
Blood (UA POC): NEGATIVE
Blood (UA POC): NEGATIVE
Glucose (UA POC): NEGATIVE
Glucose, Urine, POC: NEGATIVE
Ketones (UA POC): NEGATIVE
Ketones, Urine, POC: NEGATIVE
Leukocyte Esterase, Urine, POC: NEGATIVE
Leukocyte esterase (UA POC): NEGATIVE
Nitrite, Urine, POC: NEGATIVE
Nitrites (UA POC): NEGATIVE
Protein (UA POC): NEGATIVE
Protein, Urine, POC: NEGATIVE
Specific Gravity, Urine, POC: 1.025 NA (ref 1.001–1.035)
Specific gravity (UA POC): 1.025 (ref 1.001–1.035)
Urobilinogen (UA POC): 0.2 (ref 0.2–1)
Urobilinogen, POC: 0.2 (ref 0.2–1)
pH (UA POC): 5.5 (ref 4.6–8.0)
pH, Urine, POC: 5.5 NA (ref 4.6–8.0)

## 2020-03-10 NOTE — Progress Notes (Signed)
Sch 03/26/2020 7:00 AM CT ABDOMEN,PELVIS 30 min [facility] Cat Scan [facility] CT RM 1 GE 64

## 2020-03-10 NOTE — Progress Notes (Signed)
[name] has order for CT      To be done at  [facility]    Needed by:  NOW    Patient has a follow-up appointment:   No       If MRI, does patient have a pacemaker:       Order has been placed in connect care:  Yes     Is this a STAT order:  No     Nathan Mathis

## 2020-03-10 NOTE — Progress Notes (Signed)
Faxed imaging to [facility] [phone]; CT ABD PELV; NOW

## 2020-03-10 NOTE — Progress Notes (Signed)
Progress Notes by Donah Driver, MD at 03/10/20 1315                Author: Donah Driver, MD  Service: --  Author Type: Physician       Filed: 03/28/20 1132  Encounter Date: 03/10/2020  Status: Signed          Editor: Donah Driver, MD (Physician)                                               [name]   Jun 03, 1970   male                ICD-10-CM  ICD-9-CM             1.  Feeling of incomplete bladder emptying   R39.14  788.21  AMB POC URINALYSIS DIP STICK AUTO W/O MICRO                CT ABD PELV W WO CONT           2.  Urinary frequency   R35.0  788.41                 ASSESSMENT:   1. Sensation of incomplete bladder emptying    GU exam showed a thickened spermatic cord on the left ?hernia, right side is more normal      2. Meniere's disease, on a diuretic      PLAN:     1.  Reviewed most recent PSA - 0.51 ng/mL on 02/18/2020, WNL.    2.  UA today is negative for blood or infection   3.  GU exam showed a thickened spermatic cord on the left ?hernia, right side is more normal   4.  Recommend CT Abd/Pelv w/wo Cont to evaluate for possible hernia   5.  Follow up via telehealth to review CT           Chief Complaint       Patient presents with        ?  Other             When urinates he feels like their is a air bubble           HISTORY OF PRESENT ILLNESS:  Nathan Mathis  is a 50 y.o. male who presents  today in consultation for LUTS, referred by Minus Liberty, FNP       Patient notes that he recently moved from [place] and would like to establish care. He reports he feels a sensation of incomplete bladder emptying and has to apply pressure to lower abdomen to urinate completely. He describes a sensation of an "air bubble"  in his bladder when he urinates and when he presses on his abdomen and the bubble "moves to the side" and he finishes urinating. No significant urinary frequency or nocturia. He is not currently on any GU medications      Most recent A1c was 5.5% on 02/18/2020                   PSA  PSA     Latest Ref Rng & Units  0.00 - 4.00 ng/ml        02/18/2020  0.51           No flowsheet data found.  History reviewed. No pertinent past medical history.      History reviewed. No pertinent surgical history.        Social History          Tobacco Use         ?  Smoking status:  Never Smoker     ?  Smokeless tobacco:  Never Used       Substance Use Topics         ?  Alcohol use:  Not on file         ?  Drug use:  Not on file           No Known Allergies      History reviewed. No pertinent family history.               Review of Systems   Constitutional: Fever: No   Skin: Rash: No   HEENT: Hearing difficulty: No   Eyes: Blurred vision: No   Cardiovascular: Chest pain: No   Respiratory: Shortness of breath: No   Gastrointestinal: Nausea/vomiting: No   Musculoskeletal: Back pain: No   Neurological: Weakness: No   Psychological: Memory loss: No   Comments/additional findings:          Visit Vitals      Resp  16     Ht  5' 1.2" (1.554 m)     Wt  266 lb (120.7 kg)        BMI  49.93 kg/m??        Constitutional: WDWN, Pleasant and appropriate affect, No acute distress.     CV:  No peripheral swelling noted   Respiratory: No respiratory distress or difficulties   GI:  No abdominal masses or tenderness.  No CVA tenderness. No inguinal hernias noted.    GU Male: (03/10/2020)    EAV:[id number] normal to visual inspection. Sphincter with good tone, Rectum with no masses.  Prostate is 1.5-2+   SCROTUM:  No scrotal rash or lesions.  Normal bilateral testes and epididymis. Thickened spermatic cord on the left ?hernia, right side is  more normal   PENIS: Urethral meatus normal in location and size. No urethral discharge.   Skin: No cyanosis or jaundice.     Neuro/Psych:  Alert and oriented x 4. Affect appropriate.    Lymphatic:   No enlarged inguinal lymph nodes.           REVIEW OF LABS AND IMAGING:          Results for orders placed or performed in visit on 03/10/20     AMB POC URINALYSIS DIP STICK AUTO W/O  MICRO         Result  Value  Ref Range            Color (UA POC)  Yellow         Clarity (UA POC)  Clear         Glucose (UA POC)  Negative  Negative       Bilirubin (UA POC)  Negative  Negative       Ketones (UA POC)  Negative  Negative       Specific gravity (UA POC)  1.025  1.001 - 1.035       Blood (UA POC)  Negative  Negative       pH (UA POC)  5.5  4.6 - 8.0       Protein (UA POC)  Negative  Negative  Urobilinogen (UA POC)  0.2 mg/dL  0.2 - 1       Nitrites (UA POC)  Negative  Negative            Leukocyte esterase (UA POC)  Negative  Negative              Donah Driver, MD   [facility], [facility]         Medical documentation provided with the assistance of Rhiannon Winslow West, medical scribe for Donah Driver, MD

## 2020-03-31 ENCOUNTER — Encounter

## 2020-03-31 NOTE — Progress Notes (Signed)
Progress Notes by Haynes Kerns, NP at 03/31/20 [phone]                Author: Haynes Kerns, NP  Service: --  Author Type: Nurse Practitioner       Filed: 04/03/20 1025  Encounter Date: 03/31/2020  Status: Signed          Editor: Haynes Kerns, NP (Nurse Practitioner)               CT revealed bladder has irregular thickening with possible tethering to left fat inguinal hernia. No stones found. Small umbilical hernia. Moderate stole burden. Based on findings, recommend  FU with cysto, and starting Miralax for stool burden.

## 2020-04-08 NOTE — Telephone Encounter (Signed)
L/m for pt to c/b.     ----- Message from Haynes Kerns, NP sent at 04/03/2020 10:25 AM EST -----  CT revealed bladder has irregular thickening with possible tethering to left fat inguinal hernia. No stones found. Small umbilical hernia. Moderate stole burden. Based on findings, recommend FU with cysto, and starting Miralax for stool burden.

## 2020-04-21 ENCOUNTER — Ambulatory Visit: Admit: 2020-04-21 | Discharge: 2020-04-21 | Attending: Urology | Primary: Family

## 2020-04-21 NOTE — Progress Notes (Addendum)
Progress  Notes by Philis Fendt at 04/21/20 1545                Author: Philis Fendt  Service: --  Author TypeDagoberto Reef       Filed: 04/21/20 2152  Encounter Date: 04/21/2020  Status: Signed          Editor: Donah Driver, MD (Physician)               Patient did not answer phone will reschedule Telemedicine follow up.      Donah Driver, MD

## 2020-05-26 ENCOUNTER — Encounter: Attending: Physician Assistant | Primary: Family

## 2020-07-07 ENCOUNTER — Ambulatory Visit: Attending: Urology | Primary: Family

## 2020-07-07 ENCOUNTER — Ambulatory Visit: Admit: 2020-07-07 | Discharge: 2020-07-07 | Attending: Urology | Primary: Family

## 2020-07-07 DIAGNOSIS — R350 Frequency of micturition: Secondary | ICD-10-CM

## 2020-07-07 LAB — AMB POC URINALYSIS DIP STICK AUTO W/O MICRO
Bilirubin (UA POC): NEGATIVE
Bilirubin, Urine, POC: NEGATIVE
Blood (UA POC): NEGATIVE
Blood (UA POC): NEGATIVE
Glucose (UA POC): NEGATIVE
Glucose, Urine, POC: NEGATIVE
Ketones (UA POC): NEGATIVE
Ketones, Urine, POC: NEGATIVE
Leukocyte Esterase, Urine, POC: NEGATIVE
Leukocyte esterase (UA POC): NEGATIVE
Nitrite, Urine, POC: NEGATIVE
Nitrites (UA POC): NEGATIVE
Protein (UA POC): NEGATIVE
Protein, Urine, POC: NEGATIVE
Specific Gravity, Urine, POC: 1.02 NA (ref 1.001–1.035)
Specific gravity (UA POC): 1.02 (ref 1.001–1.035)
Urobilinogen (UA POC): 0.2 (ref 0.2–1)
Urobilinogen, POC: 0.2 (ref 0.2–1)
pH (UA POC): 6 (ref 4.6–8.0)
pH, Urine, POC: 6 NA (ref 4.6–8.0)

## 2020-07-07 MED ORDER — TAMSULOSIN SR 0.4 MG 24 HR CAP
0.4 mg | ORAL_CAPSULE | Freq: Every day | ORAL | 1 refills | Status: AC
Start: 2020-07-07 — End: ?

## 2020-07-07 MED ORDER — CEPHALEXIN 500 MG CAP
500 mg | ORAL_CAPSULE | ORAL | 0 refills | Status: AC
Start: 2020-07-07 — End: 2020-07-07

## 2020-07-07 NOTE — Progress Notes (Signed)
Cystoscopy Procedure    Patient Name: Nathan Mathis            Date of Procedure: 07/07/2020     Pre-procedure Diagnosis:   Encounter Diagnoses     ICD-10-CM ICD-9-CM   1. Urinary frequency  R35.0 788.41   2. Feeling of incomplete bladder emptying  R39.14 788.21       1. Sensation of incomplete bladder emptying   GU exam showed a thickened spermatic cord on the left ?hernia, right side is more normal   CT A/P 03/26/2020 - Bladder is tethered into the left fat-containing inguinal hernia and there is irregular wall thickening/trabeculation of the anterior left-lateral bladder wall which could reflect focal cystitis, though the relative focal involvement does raise concern for potential neoplasm   Cysto 07/07/2020 - short fossa, minimal trilobar hypertrophy,  half of bladder is within left inguinal hernia     2. Peyronie's plaque    3. Meniere's disease, on a diuretic      Post-procedure Diagnosis: Same    Plan:  Rx for Flomax provided. Risks, benefits, and SE discussed.  Keflex provided in office.   Follow up in 3 months.     History of Present Illness:  Nathan Mathis is a 50 y.o. male who presents today for cystoscopy to evaluate incomplete bladder emptying. Patient notes that he recently moved from [place] and would like to establish care. He reports he feels a sensation of incomplete bladder emptying and has to apply pressure to lower abdomen to urinate completely. He describes a sensation of an "air bubble" in his bladder when he urinates and when he presses on his abdomen and the bubble "moves to the side" and he finishes urinating. No significant urinary frequency or nocturia. He is not currently on any GU medications    Most recent A1c was 5.5% on 02/18/2020      Findings as follows:    Meatus: normal  Urethra: normal  Prostate: short fossa, minimal trilobar hypertrophy  Bladder neck: normal  Trigone:  normal  Trabeculation:normal  Diverticuli:  none  Lesion:  none    Additional Findings: half of bladder is  within left inguinal hernia + circumferential Peyronie's plaque at base of the shaft    Antibiotic provided:  Yes, Keflex    Lab / Imaging:   Results for orders placed or performed in visit on 07/07/20   AMB POC URINALYSIS DIP STICK AUTO W/O MICRO   Result Value Ref Range    Color (UA POC) Yellow     Clarity (UA POC) Clear     Glucose (UA POC) Negative Negative    Bilirubin (UA POC) Negative Negative    Ketones (UA POC) Negative Negative    Specific gravity (UA POC) 1.020 1.001 - 1.035    Blood (UA POC) Negative Negative    pH (UA POC) 6.0 4.6 - 8.0    Protein (UA POC) Negative Negative    Urobilinogen (UA POC) 0.2 mg/dL 0.2 - 1    Nitrites (UA POC) Negative Negative    Leukocyte esterase (UA POC) Negative Negative     CT A/P 03/26/2020  IMPRESSION   1. Bladder is tethered into the left fat-containing inguinal hernia and there is irregular wall thickening/trabeculation of the anterior left-lateral bladder wall which could reflect focal cystitis, though the relative focal involvement does raise concern for potential neoplasm. Consider further evaluation with cystoscopy.   2. No renal calculi or hydronephrosis.   3. Bilateral fat-containing inguinal hernias  and small fat-containing umbilical hernia. No bowel containing hernia.   4. Moderate colonic stool burden without obstruction. Colonic diverticulosis without diverticulitis or other acute inflammatory bowel process.     Consent:  All risks, benefits and options were reviewed in detail and the patient agrees to procedure. Risks include but are not limited to bleeding, infection, sepsis, death, dysuria and others.     Procedure:  The patient was placed in the supine position, and prepped and draped in the normal fashion. 5 ml of 4% Lidocaine gel was placed in the urethra. Once adequate anesthesia was achieved; the flexible cystoscope was placed into the bladder.     No past medical history on file.  No past surgical history on file.  No family history on file.  Social  History     Socioeconomic History   ??? Marital status: MARRIED     Spouse name: Not on file   ??? Number of children: Not on file   ??? Years of education: Not on file   ??? Highest education level: Not on file   Occupational History   ??? Not on file   Tobacco Use   ??? Smoking status: Never Smoker   ??? Smokeless tobacco: Never Used   Substance and Sexual Activity   ??? Alcohol use: Not on file   ??? Drug use: Not on file   ??? Sexual activity: Not on file   Other Topics Concern   ??? Not on file   Social History Narrative   ??? Not on file     Social Determinants of Health     Financial Resource Strain:    ??? Difficulty of Paying Living Expenses: Not on file   Food Insecurity:    ??? Worried About Running Out of Food in the Last Year: Not on file   ??? Ran Out of Food in the Last Year: Not on file   Transportation Needs:    ??? Lack of Transportation (Medical): Not on file   ??? Lack of Transportation (Non-Medical): Not on file   Physical Activity:    ??? Days of Exercise per Week: Not on file   ??? Minutes of Exercise per Session: Not on file   Stress:    ??? Feeling of Stress : Not on file   Social Connections:    ??? Frequency of Communication with Friends and Family: Not on file   ??? Frequency of Social Gatherings with Friends and Family: Not on file   ??? Attends Religious Services: Not on file   ??? Active Member of Clubs or Organizations: Not on file   ??? Attends Banker Meetings: Not on file   ??? Marital Status: Not on file   Intimate Partner Violence:    ??? Fear of Current or Ex-Partner: Not on file   ??? Emotionally Abused: Not on file   ??? Physically Abused: Not on file   ??? Sexually Abused: Not on file   Housing Stability:    ??? Unable to Pay for Housing in the Last Year: Not on file   ??? Number of Places Lived in the Last Year: Not on file   ??? Unstable Housing in the Last Year: Not on file     No Known Allergies  Current Outpatient Medications   Medication Sig Dispense Refill   ??? zolpidem (AMBIEN) 10 mg tablet      ??? tamsulosin (FLOMAX) 0.4  mg capsule Take 1 Capsule by mouth daily (after dinner). 90 Capsule 1   ???  carvediloL (COREG) 6.25 mg tablet carvedilol 6.25 mg tablet   Take 1 tablet twice a day by oral route.     ??? spironolactone (ALDACTONE) 50 mg tablet spironolactone 50 mg tablet   one tablet BID     ??? rosuvastatin (CRESTOR) 10 mg tablet rosuvastatin 10 mg tablet     ??? meclizine (ANTIVERT) 25 mg tablet meclizine 25 mg tablet   Take 1 tablet 3 times a day by oral route as needed.     ??? ondansetron hcl (ZOFRAN) 4 mg tablet ondansetron HCl 4 mg tablet   QID PRN         Diagnoses:   Encounter Diagnoses     ICD-10-CM ICD-9-CM   1. Urinary frequency  R35.0 788.41   2. Feeling of incomplete bladder emptying  R39.14 788.21       Universal Procedure Pause:  1. Correct patient confirmed:  yes  2. Allergies confirmed:  yes  3. Patient taking antiplatelet medications:  no  4. Patient taking anticoagulants:  no  5. Correct procedure and side confirmed and consent signed:  yes  6. Correct antibiotics confirmed:  yes     Patient's BMI is out of the normal parameters.  Information about BMI was given and patient was advised to follow-up with their PCP for further management.    CC: Minus Liberty, FNP     Donah Driver, MD  [facility] [place]  [address]   [place], [place] [address]  [phone] (office)      Medical documentation provided with the assistance of [place] Hall, medical scribe for Donah Driver, MD on 07/07/2020.

## 2020-07-15 ENCOUNTER — Telehealth

## 2020-07-15 NOTE — Telephone Encounter (Signed)
Pt states Dr. Janalyn Rouse was going to send a referral to Dr. Aundria Rud and he would like to know the status ;    Dr. William Hamburger fax number: [phone]  Phone: [phone]

## 2020-07-16 NOTE — Telephone Encounter (Signed)
Ok faxed, per [facility] ok

## 2020-08-03 NOTE — Telephone Encounter (Signed)
Patient and wife called again regarding the referral    ;  They said Dr. Janalyn Rouse wants to refer the Pt to see Dr. Aundria Rud, but then they called Dr. William Hamburger office at [phone] this morning and they said, they haven't received any referral yet;    Pt  wanted to know the status of the referral and how come that Dr. William Hamburger said that they haven't received any.. They said, they called our office several x but until now no updats yet.Marland Kitchen    Fax # [phone].Marland Kitchen    Please call Patient or wife regarding the status of the referral.    CBN:  [phone]

## 2020-08-04 NOTE — Telephone Encounter (Signed)
Referral was faxed on 07/15/2020 as documented in the chart. I faxed referral again today twice.

## 2020-08-07 DIAGNOSIS — E782 Mixed hyperlipidemia: Secondary | ICD-10-CM

## 2020-08-08 ENCOUNTER — Inpatient Hospital Stay: Admit: 2020-08-08 | Payer: PRIVATE HEALTH INSURANCE | Primary: Family

## 2020-08-08 LAB — LIPID PANEL
CHOL/HDL Ratio: 4 {ratio} (ref 0.0–5.0)
Chol/HDL Ratio: 4 Ratio (ref 0.0–5.0)
Cholesterol, Total: 153 mg/dl (ref 0–199)
Cholesterol, total: 153 mg/dL (ref 0–199)
HDL Cholesterol: 38 mg/dl — ABNORMAL LOW (ref 40–60)
HDL: 38 mg/dl — ABNORMAL LOW (ref 40–60)
LDL Calculated: 88 mg/dl (ref 0–130)
LDL, calculated: 88 mg/dl (ref 0–130)
Triglyceride: 136 mg/dL (ref 0–150)
Triglycerides: 136 mg/dl (ref 0–150)

## 2020-10-09 ENCOUNTER — Encounter: Attending: Urology | Primary: Family

## 2021-02-11 ENCOUNTER — Inpatient Hospital Stay: Admit: 2021-02-11 | Payer: PRIVATE HEALTH INSURANCE | Primary: Family

## 2021-02-11 DIAGNOSIS — I10 Essential (primary) hypertension: Secondary | ICD-10-CM

## 2021-02-11 LAB — CBC WITH AUTOMATED DIFF
BASOPHILS: 0.8 % (ref 0–3)
EOSINOPHILS: 5.8 % — ABNORMAL HIGH (ref 0–5)
HCT: 45.9 % (ref 37.0–50.0)
HGB: 14.7 gm/dl (ref 12.4–17.2)
IMMATURE GRANULOCYTES: 0.2 % (ref 0.0–3.0)
LYMPHOCYTES: 31.4 % (ref 28–48)
MCH: 27 pg (ref 23.0–34.6)
MCHC: 32 gm/dl (ref 30.0–36.0)
MCV: 84.4 fL (ref 80.0–98.0)
MONOCYTES: 10 % (ref 1–13)
MPV: 10.9 fL — ABNORMAL HIGH (ref 6.0–10.0)
NEUTROPHILS: 51.8 % (ref 34–64)
NRBC: 0 (ref 0–0)
PLATELET: 305 10*3/uL (ref 140–450)
RBC: 5.44 M/uL (ref 3.80–5.70)
RDW-SD: 46.5 — ABNORMAL HIGH (ref 35.1–43.9)
WBC: 5.3 10*3/uL (ref 4.0–11.0)

## 2021-02-11 LAB — METABOLIC PANEL, COMPREHENSIVE
ALT (SGPT): 25 U/L (ref 10–49)
AST (SGOT): 21 U/L (ref 0.0–33.9)
Albumin: 4 gm/dl (ref 3.4–5.0)
Alk. phosphatase: 70 U/L (ref 46–116)
Anion gap: 9 mmol/L (ref 5–15)
BUN: 10 mg/dl (ref 9–23)
Bilirubin, total: 0.6 mg/dl (ref 0.30–1.20)
CO2: 28 mEq/L (ref 20–31)
Calcium: 10.2 mg/dl (ref 8.7–10.4)
Chloride: 103 mEq/L (ref 98–107)
Creatinine: 0.87 mg/dl (ref 0.70–1.30)
GFR est AA: >60
GFR est non-AA: >60
Glucose: 113 mg/dl — ABNORMAL HIGH (ref 74–106)
Potassium: 5 mEq/L (ref 3.5–5.1)
Protein, total: 7.5 gm/dl (ref 5.7–8.2)
Sodium: 140 mEq/L (ref 136–145)

## 2021-02-11 LAB — CBC WITH AUTO DIFFERENTIAL
Basophils %: 0.8 % (ref 0–3)
Eosinophils %: 5.8 % — ABNORMAL HIGH (ref 0–5)
Hematocrit: 45.9 % (ref 37.0–50.0)
Hemoglobin: 14.7 gm/dl (ref 12.4–17.2)
Immature Granulocytes: 0.2 % (ref 0.0–3.0)
Lymphocytes %: 31.4 % (ref 28–48)
MCH: 27 pg (ref 23.0–34.6)
MCHC: 32 gm/dl (ref 30.0–36.0)
MCV: 84.4 fL (ref 80.0–98.0)
MPV: 10.9 fL — ABNORMAL HIGH (ref 6.0–10.0)
Monocytes %: 10 % (ref 1–13)
Neutrophils %: 51.8 % (ref 34–64)
Nucleated RBCs: 0 (ref 0–0)
Platelets: 305 10*3/uL (ref 140–450)
RBC: 5.44 M/uL (ref 3.80–5.70)
RDW-SD: 46.5 — ABNORMAL HIGH (ref 35.1–43.9)
WBC: 5.3 10*3/uL (ref 4.0–11.0)

## 2021-02-11 LAB — COMPREHENSIVE METABOLIC PANEL
ALT: 25 U/L (ref 10–49)
AST: 21 U/L (ref 0.0–33.9)
Albumin: 4 gm/dl (ref 3.4–5.0)
Alkaline Phosphatase: 70 U/L (ref 46–116)
Anion Gap: 9 mmol/L (ref 5–15)
BUN: 10 mg/dl (ref 9–23)
CO2: 28 mEq/L (ref 20–31)
Calcium: 10.2 mg/dl (ref 8.7–10.4)
Chloride: 103 mEq/L (ref 98–107)
Creatinine: 0.87 mg/dl (ref 0.70–1.30)
EGFR IF NonAfrican American: >60
GFR African American: >60
Glucose: 113 mg/dl — ABNORMAL HIGH (ref 74–106)
Potassium: 5 mEq/L (ref 3.5–5.1)
Sodium: 140 mEq/L (ref 136–145)
Total Bilirubin: 0.6 mg/dl (ref 0.30–1.20)
Total Protein: 7.5 gm/dl (ref 5.7–8.2)

## 2021-02-11 LAB — PSA SCREENING: Pros. Spec. Antigen: 0.42 ng/ml (ref 0.00–4.00)

## 2021-02-11 LAB — PSA SCREENING (SCREENING): Prostate Specific Ag: 0.42 ng/ml (ref 0.00–4.00)

## 2021-08-04 ENCOUNTER — Inpatient Hospital Stay: Admit: 2021-08-04 | Payer: PRIVATE HEALTH INSURANCE | Primary: Family

## 2021-08-04 DIAGNOSIS — I10 Essential (primary) hypertension: Secondary | ICD-10-CM

## 2021-08-05 LAB — HEMOGLOBIN A1C: Hemoglobin A1C: 5.5 % (ref 3.8–5.6)

## 2021-08-05 LAB — CBC WITH AUTO DIFFERENTIAL
Basophils: 0.8 % (ref 0–3)
Eosinophils: 9.1 % — ABNORMAL HIGH (ref 0–5)
Hematocrit: 45.3 % (ref 36.0–55.0)
Hemoglobin: 14.7 gm/dl (ref 13.0–18.0)
Immature Granulocytes: 0.2 % (ref 0.0–3.0)
Lymphocytes: 30.4 % (ref 28–48)
MCH: 30.2 pg (ref 23.0–34.6)
MCHC: 32.5 gm/dl (ref 30.0–36.0)
MCV: 93 fL (ref 80.0–98.0)
MPV: 11 fL — ABNORMAL HIGH (ref 6.0–10.0)
Monocytes: 10.1 % (ref 1–13)
Neutrophils Segmented: 49.4 % (ref 34–64)
Nucleated RBCs: 0 (ref 0–0)
Platelets: 282 10*3/uL (ref 140–450)
RBC: 4.87 M/uL (ref 3.80–5.70)
RDW: 48.6 — ABNORMAL HIGH (ref 35.1–43.9)
WBC: 5.3 10*3/uL (ref 4.0–11.0)

## 2021-08-05 LAB — COMPREHENSIVE METABOLIC PANEL
ALT: 24 U/L (ref 10–49)
AST: 25 U/L (ref 0.0–33.9)
Albumin: 3.7 gm/dl (ref 3.4–5.0)
Alkaline Phosphatase: 72 U/L (ref 46–116)
Anion Gap: 9 mmol/L (ref 5–15)
BUN: 15 mg/dl (ref 9–23)
CO2: 27 mEq/L (ref 20–31)
Calcium: 9.9 mg/dl (ref 8.7–10.4)
Chloride: 104 mEq/L (ref 98–107)
Creatinine: 1 mg/dl (ref 0.70–1.30)
GFR African American: >60
GFR Non-African American: >60
Glucose: 108 mg/dl — ABNORMAL HIGH (ref 74–106)
Potassium: 5 mEq/L (ref 3.5–5.1)
Sodium: 140 mEq/L (ref 136–145)
Total Bilirubin: 0.8 mg/dl (ref 0.30–1.20)
Total Protein: 7.2 gm/dl (ref 5.7–8.2)

## 2021-08-05 LAB — LIPID PANEL
Chol/HDL Ratio: 3.8 Ratio (ref 0.0–5.0)
Cholesterol: 165 mg/dl (ref 0–199)
HDL: 43 mg/dl (ref 40–60)
LDL Calculated: 86 mg/dl (ref 0–130)
Triglycerides: 178 mg/dl — ABNORMAL HIGH (ref 0–150)

## 2021-10-12 DIAGNOSIS — Z01818 Encounter for other preprocedural examination: Secondary | ICD-10-CM

## 2021-10-13 ENCOUNTER — Inpatient Hospital Stay: Admit: 2021-10-13 | Payer: PRIVATE HEALTH INSURANCE | Primary: Family

## 2021-10-13 LAB — COMPREHENSIVE METABOLIC PANEL
ALT: 30 U/L (ref 10–49)
AST: 25 U/L (ref 0.0–33.9)
Albumin: 3.9 gm/dl (ref 3.4–5.0)
Alkaline Phosphatase: 74 U/L (ref 46–116)
Anion Gap: 7 mmol/L (ref 5–15)
BUN: 13 mg/dl (ref 9–23)
CO2: 26 mEq/L (ref 20–31)
Calcium: 9.8 mg/dl (ref 8.7–10.4)
Chloride: 107 mEq/L (ref 98–107)
Creatinine: 1.04 mg/dl (ref 0.70–1.30)
GFR African American: >60
GFR Non-African American: >60
Glucose: 79 mg/dl (ref 74–106)
Potassium: 5 mEq/L (ref 3.5–5.1)
Sodium: 140 mEq/L (ref 136–145)
Total Bilirubin: 0.5 mg/dl (ref 0.30–1.20)
Total Protein: 6.5 gm/dl (ref 5.7–8.2)

## 2021-10-13 LAB — CBC WITH AUTO DIFFERENTIAL
Basophils: 0.8 % (ref 0–3)
Eosinophils: 8.7 % — ABNORMAL HIGH (ref 0–5)
Hematocrit: 47.8 % (ref 36.0–55.0)
Hemoglobin: 15.3 gm/dl (ref 13.0–18.0)
Immature Granulocytes: 0.3 % (ref 0.0–3.0)
Lymphocytes: 31.6 % (ref 28–48)
MCH: 29.8 pg (ref 23.0–34.6)
MCHC: 32 gm/dl (ref 30.0–36.0)
MCV: 93 fL (ref 80.0–98.0)
MPV: 10.9 fL — ABNORMAL HIGH (ref 6.0–10.0)
Monocytes: 9.6 % (ref 1–13)
Neutrophils Segmented: 49 % (ref 34–64)
Nucleated RBCs: 0 (ref 0–0)
Platelets: 305 10*3/uL (ref 140–450)
RBC: 5.14 M/uL (ref 3.80–5.70)
RDW: 45.3 — ABNORMAL HIGH (ref 35.1–43.9)
WBC: 6.2 10*3/uL (ref 4.0–11.0)

## 2021-10-26 ENCOUNTER — Encounter: Primary: Family

## 2021-11-30 ENCOUNTER — Encounter: Primary: Family

## 2021-11-30 NOTE — Progress Notes (Deleted)
Nathan Mathis  DOB: Mar 10, 1970  Encounter Date:  11/30/2021    No diagnosis found.      ASSESSMENT:     1. Sensation of incomplete bladder emptying              GU exam showed a thickened spermatic cord on the left ?hernia, right side is more normal              CT A/P 03/26/2020 - Bladder is tethered into the left fat-containing inguinal hernia and there is irregular wall thickening/trabeculation of the anterior left-lateral bladder wall which could reflect focal cystitis, though the relative focal involvement does raise concern for potential neoplasm              Cysto 07/07/2020 - short fossa, minimal trilobar hypertrophy,  half of bladder is within left inguinal hernia    Started on Flomax 0.4 mg daily on 07/07/20     2. Peyronie's plaque     3. Meniere's disease, on a diuretic    4. Prostate Cancer Screening   Most recent PSA was 0.42 ng/mL on 02/11/21          PLAN:  ***  Continue Flomax 0.4 mg daily   Follow up in ***.           DISCUSSION: ***      No chief complaint on file.           HISTORY OF PRESENT ILLNESS:   Nathan Mathis is a 52 y.o. male who presents today in follow up for incomplete bladder emptying. Patient was last seen by Dr. Janalyn Rouse on 07/07/20, patient was to follow up in 3 months but was lost to follow up.         No results found for this visit on 11/30/21.      No past medical history on file.  No past surgical history on file.  No family history on file.  Social History     Socioeconomic History    Marital status: Married     Spouse name: Not on file    Number of children: Not on file    Years of education: Not on file    Highest education level: Not on file   Occupational History    Not on file   Tobacco Use    Smoking status: Never    Smokeless tobacco: Never   Substance and Sexual Activity    Alcohol use: Not on file    Drug use: Not on file    Sexual activity: Not on file   Other Topics Concern    Not on file   Social History Narrative    Not on file     Social Determinants of Health      Financial Resource Strain: Not on file   Food Insecurity: Not on file   Transportation Needs: Not on file   Physical Activity: Not on file   Stress: Not on file   Social Connections: Not on file   Intimate Partner Violence: Not on file   Housing Stability: Not on file     Not on File  Current Outpatient Medications   Medication Sig Dispense Refill    carvedilol (COREG) 6.25 MG tablet carvedilol 6.25 mg tablet   Take 1 tablet twice a day by oral route.      meclizine (ANTIVERT) 25 MG tablet meclizine 25 mg tablet   Take 1 tablet 3 times a day by oral route as needed.  ondansetron (ZOFRAN) 4 MG tablet ondansetron HCl 4 mg tablet   QID PRN      rosuvastatin (CRESTOR) 10 MG tablet rosuvastatin 10 mg tablet      spironolactone (ALDACTONE) 50 MG tablet spironolactone 50 mg tablet   one tablet BID      tamsulosin (FLOMAX) 0.4 MG capsule Take 0.4 mg by mouth      zolpidem (AMBIEN) 10 MG tablet ceived the following from [facility]: Outside name: zolpidem (AMBIEN) 10 mg tablet       No current facility-administered medications for this visit.       Physical Examination:  There were no vitals taken for this visit.  Constitutional: WDWN, Pleasant and appropriate affect, No acute distress.    CV:  No peripheral swelling noted  Respiratory: No respiratory distress or difficulties  Abdomen:  No abdominal masses or tenderness.  No CVA tenderness. No hernias noted.   GU Male ***:    HQI:[id number] normal to visual inspection, no erythema or irritation, Sphincter with good tone, Rectum with no hemorrhoids, fissures or masses.  Prostate is {Small, med, mod, large:15557}  SCROTUM:  No scrotal rash or lesions noticed.  Normal bilateral testes and epididymis.   PENIS: Urethral meatus normal in location and size. No urethral discharge.  Skin: No jaundice.    Neuro/Psych:  Alert and oriented x 3. Affect appropriate.   Lymphatic:   No enlarged inguinal lymph nodes.      Labs/Radiology:  No results found for this visit  on 11/30/21.      CC: Nathan Modena, FNP     Raoul Pitch, MPA, PA-C  [facility] [place], [place] [address]  Office: [phone]  Fax: [phone]       Medical documentation is provided with the assistance of Oralia Manis, note prepped for Oralia Manis on 11/30/2021

## 2021-12-02 ENCOUNTER — Encounter
Admit: 2021-12-02 | Discharge: 2021-12-03 | Payer: PRIVATE HEALTH INSURANCE | Attending: Student in an Organized Health Care Education/Training Program | Primary: Family

## 2021-12-02 DIAGNOSIS — N486 Induration penis plastica: Secondary | ICD-10-CM

## 2021-12-02 LAB — AMB POC URINALYSIS DIP STICK AUTO W/O MICRO
Bilirubin, Urine, POC: NEGATIVE
Blood, Urine, POC: NEGATIVE
Glucose, Urine, POC: NEGATIVE
Ketones, Urine, POC: NEGATIVE
Leukocyte Esterase, Urine, POC: NEGATIVE
Nitrite, Urine, POC: NEGATIVE
Protein, Urine, POC: NEGATIVE
Specific Gravity, Urine, POC: 1.02 (ref 1.001–1.035)
Urobilinogen, POC: 0.2
pH, Urine, POC: 6 (ref 4.6–8.0)

## 2021-12-02 NOTE — Progress Notes (Unsigned)
Nathan Mathis   11-Jun-1970   12/02/2021    ASSESSMENT:   1. Peyronie Disease  Curvature: 30 deg, Dorsal  Palpable plaque: Yes - dorsal base  ED: no  Meds: n/a    PLAN:    1.  Discussed treatment for Peyronie's with patient.  Went over surgical versus nonsurgical treatment options.  Given patient is concerned with his length do not feel he is a great candidate for penile plication.  Additionally he is able to achieve good erections spontaneously therefore do not feel he is a great candidate for plaque excision and grafting.    2.  Discussed [name] and treatment course with patient.  He is interested but wishes to talk with his wife.  We will call him to schedule [name].      Chief Complaint   Patient presents with    Other     Patient is here today for calcium build up.  Patient has recent CT abd done 11/29/2021.         HISTORY OF PRESENT ILLNESS: Nathan Mathis is a 51 y.o. male who presents today for follow up for  Penile plaque and curvature.    Patient history of bilateral hernia containing bladder.  This is since been repaired.  Overall has been doing well from this perspective.    Additionally he has had longstanding issues of penile curvature. He noted curvature starting 2 year(s) ago. He reports history of trauma where he reported pain while having sex, no swelling/eggplant deformity.   Endorses pain during sexual intercourse.  He is able to achieve erection without pain.  No pain for his partner.   Curvature is self described as Dorsal. 30 degree. No hinging. No ED.      Prior Hx:  Patient notes that he recently moved from [place] and would like to establish care. He reports he feels a sensation of incomplete bladder emptying and has to apply pressure to lower abdomen to urinate completely. He describes a sensation of an "air bubble" in his bladder when he urinates and when he presses on his abdomen and the bubble "moves to the side" and he finishes urinating. No significant urinary frequency or  nocturia. He is not currently on any GU medications     Most recent A1c was 5.5% on 02/18/2020        Findings as follows:    Meatus: normal  Urethra: normal  Prostate: short fossa, minimal trilobar hypertrophy  Bladder neck: normal  Trigone:  normal  Trabeculation:normal  Diverticuli:  none  Lesion:  none     Additional Findings: half of bladder is within left inguinal hernia + circumferential Peyronie's plaque at base of the shaft      REVIEW OF LABS & IMAGING:  CT A/P 03/26/2020  IMPRESSION   1. Bladder is tethered into the left fat-containing inguinal hernia and there is irregular wall thickening/trabeculation of the anterior left-lateral bladder wall which could reflect focal cystitis, though the relative focal involvement does raise concern for potential neoplasm. Consider further evaluation with cystoscopy.   2. No renal calculi or hydronephrosis.   3. Bilateral fat-containing inguinal hernias and small fat-containing umbilical hernia. No bowel containing hernia.   4. Moderate colonic stool burden without obstruction. Colonic diverticulosis without diverticulitis or other acute inflammatory bowel process.     Past Medical History:   Diagnosis Date    Hypertension        Past Surgical History:   Procedure Laterality Date  HERNIA REPAIR         No Known Allergies    Current Outpatient Medications   Medication Sig    Daridorexant HCl (QUVIVIQ) 50 MG TABS Take 1 tablet every day by oral route at bedtime.    nortriptyline (PAMELOR) 10 MG capsule     gabapentin (NEURONTIN) 800 MG tablet     Coenzyme Q10 10 MG CAPS Take 10 mg by mouth    atorvastatin (LIPITOR) 20 MG tablet Take 1 tablet by mouth nightly    Omega-3 Fatty Acids (FISH OIL) 500 MG CAPS Take 1,200 mg by mouth    carvedilol (COREG) 6.25 MG tablet carvedilol 6.25 mg tablet   Take 1 tablet twice a day by oral route.    meclizine (ANTIVERT) 25 MG tablet meclizine 25 mg tablet   Take 1 tablet 3 times a day by oral route as needed.    ondansetron (ZOFRAN) 4 MG  tablet ondansetron HCl 4 mg tablet   QID PRN    rosuvastatin (CRESTOR) 10 MG tablet rosuvastatin 10 mg tablet    spironolactone (ALDACTONE) 50 MG tablet spironolactone 50 mg tablet   one tablet BID     No current facility-administered medications for this visit.       No family history on file.    Social History     Socioeconomic History    Marital status: Married     Spouse name: None    Number of children: None    Years of education: None    Highest education level: None   Tobacco Use    Smoking status: Never    Smokeless tobacco: Never         PHYSICAL EXAMINATION:   Resp 20   Wt 120.2 kg (265 lb)   BMI 49.74 kg/m   Constitutional: No acute distress.    CV:  No peripheral edema  Respiratory: No respiratory distress, no audible wheezes.  GU Male:  No CVA tenderness bilaterally.  SCROTUM:  No scrotal rash or lesions.    PENIS: Normal circumcised phallus. Urethral meatus orthotopic and patent. No urethral discharge. Plaque at dorsal aspect, base of penis        This note was created using Dragon voice recognition software / speech to text program. Although reasonable attempts were made to ensure accuracy, there may be unintentional errors as a result of incorrect voice recognition / transcription which were not appreciated and corrected at the end of this visit and completion of this note.      [name], MD    [address]  [place], [place] [address]  [phone]       ICD-10-CM    1. Peyronie disease  N48.6       2. Feeling of incomplete bladder emptying  R39.14 AMB POC URINALYSIS DIP STICK AUTO W/O MICRO

## 2021-12-23 NOTE — Procedures (Signed)
Formatting of this note might be different from the original.  Images from the original note were not included.      Vascular & Interventional Radiology Brief Procedure Note    Procedure(s) Performed:  Injection for cervical myelogram.    Procedure Date: 12/23/2021    Interventional Radiologist: Claudia Desanctis    Assisstant: None    Pre-operative Diagnosis:  Neck pain and right arm tingling.    Post-operative Diagnosis: Same as pre-op dx    Anesthesia: Local     Findings:  Successful intrathecal injection of 10 mL of Omnipaque 300 at the L3 level.    Complications: None    Estimated Blood Loss:  Minimal    Blood Administered: None    Tubes and Drains: None    Grafts/Implants: None    Specimens: None    Condition: Good    Plan: Bedrest.  Conventional and CT imaging.    Resumption of Anticoagulation:  N/A    Jadene Pierini, MD  [facility]  Vascular & Interventional Radiology  Pager # [phone]    Angio/IR at [facility]: [phone]  (after hours [phone])  Angio/IR at [facility]:    [phone]  (after hours [phone])  Angio/IR at [facility]:      [phone]  (after hours [id number])    12/23/2021  Electronically signed by Jadene Pierini, MD at 12/23/2021 12:07 PM EST

## 2022-03-18 ENCOUNTER — Inpatient Hospital Stay: Admit: 2022-03-18 | Payer: PRIVATE HEALTH INSURANCE | Primary: Family

## 2022-03-18 DIAGNOSIS — Z01818 Encounter for other preprocedural examination: Secondary | ICD-10-CM

## 2022-03-19 LAB — CBC WITH AUTO DIFFERENTIAL
Basophils: 0.9 % (ref 0–3)
Eosinophils: 6.2 % — ABNORMAL HIGH (ref 0–5)
Hematocrit: 43.5 % (ref 36.0–55.0)
Hemoglobin: 14.3 gm/dl (ref 13.0–18.0)
Immature Granulocytes: 0.2 % (ref 0.0–3.0)
Lymphocytes: 35.4 % (ref 28–48)
MCH: 28.1 pg (ref 23.0–34.6)
MCHC: 32.9 gm/dl (ref 30.0–36.0)
MCV: 85.5 fL (ref 80.0–98.0)
MPV: 10.5 fL — ABNORMAL HIGH (ref 6.0–10.0)
Monocytes: 11.3 % (ref 1–13)
Neutrophils Segmented: 46 % (ref 34–64)
Nucleated RBCs: 0 (ref 0–0)
Platelets: 399 10*3/uL (ref 140–450)
RBC: 5.09 M/uL (ref 3.80–5.70)
RDW: 40.4 (ref 35.1–43.9)
WBC: 5.7 10*3/uL (ref 4.0–11.0)

## 2022-03-19 LAB — URINALYSIS
Bilirubin, Urine: NEGATIVE
Blood, Urine: NEGATIVE
Glucose, Ur: NEGATIVE mg/dl
Ketones, Urine: NEGATIVE mg/dl
Leukocyte Esterase, Urine: NEGATIVE
Nitrite, Urine: NEGATIVE
Protein, Urine: NEGATIVE mg/dl
Specific Gravity, Urine: 1.01 (ref 1.005–1.030)
Urobilinogen, Urine: 0.2 mg/dl (ref 0.0–1.0)
pH, Urine: 6 (ref 5.0–9.0)

## 2022-03-19 LAB — COMPREHENSIVE METABOLIC PANEL
ALT: 34 U/L (ref 10–49)
AST: 32 U/L (ref 0.0–33.9)
Albumin: 3.9 gm/dl (ref 3.4–5.0)
Alkaline Phosphatase: 76 U/L (ref 46–116)
Anion Gap: 5 mmol/L (ref 5–15)
BUN: 12 mg/dl (ref 9–23)
CO2: 28 mEq/L (ref 20–31)
Calcium: 10.1 mg/dl (ref 8.7–10.4)
Chloride: 105 mEq/L (ref 98–107)
Creatinine: 0.94 mg/dl (ref 0.70–1.30)
GFR African American: >60
GFR Non-African American: >60
Glucose: 61 mg/dl — ABNORMAL LOW (ref 74–106)
Potassium: 4.5 mEq/L (ref 3.5–5.1)
Sodium: 138 mEq/L (ref 136–145)
Total Bilirubin: 0.4 mg/dl (ref 0.30–1.20)
Total Protein: 7.4 gm/dl (ref 5.7–8.2)

## 2022-03-31 NOTE — Telephone Encounter (Signed)
LMOVM for patient to return call directly to discuss signing of [name] benefit investigation form.    Provided direct call back along with extension number.

## 2022-04-05 NOTE — Telephone Encounter (Signed)
LMOVM for patient to return call to discuss [name].  Consent forms uploaded into my chart.  My Chart message sent.

## 2022-04-13 NOTE — Telephone Encounter (Addendum)
LMOM for return call.           ----- Message from Val Riles, MD sent at 03/03/2022  4:35 PM EST -----  Caryl Pina,    Clearing out my inbox including reminders. This patient has peyronies, was possible interested in xiaflex. Looks like he doesn't have a follow-up. Do you mind reaching out now that we are scheduling xiaflex and see if he is interested/wants to schedule follow-up or if he prefers to call us if he needs [place].    Thanks,  Val Riles, MD

## 2022-05-13 ENCOUNTER — Inpatient Hospital Stay: Admit: 2022-05-13 | Payer: PRIVATE HEALTH INSURANCE | Primary: Family

## 2022-05-13 DIAGNOSIS — Z01818 Encounter for other preprocedural examination: Secondary | ICD-10-CM

## 2022-05-13 LAB — CBC WITH AUTO DIFFERENTIAL
Basophils: 0.6 % (ref 0–3)
Eosinophils: 5.5 % — ABNORMAL HIGH (ref 0–5)
Hematocrit: 47.1 % (ref 36.0–55.0)
Hemoglobin: 15.4 gm/dl (ref 13.0–18.0)
Immature Granulocytes %: 0.2 % (ref 0.0–3.0)
Lymphocytes: 28.7 % (ref 28–48)
MCH: 27.9 pg (ref 23.0–34.6)
MCHC: 32.7 gm/dl (ref 30.0–36.0)
MCV: 85.5 fL (ref 80.0–98.0)
MPV: 11.1 fL — ABNORMAL HIGH (ref 6.0–10.0)
Monocytes: 11.7 % (ref 1–13)
Neutrophils Segmented: 53.3 % (ref 34–64)
Nucleated RBCs: 0 (ref 0–0)
Platelets: 329 10*3/uL (ref 140–450)
RBC: 5.51 M/uL (ref 3.80–5.70)
RDW: 42.6 (ref 35.1–43.9)
WBC: 6.3 10*3/uL (ref 4.0–11.0)

## 2022-05-13 LAB — COMPREHENSIVE METABOLIC PANEL
ALT: 29 U/L (ref 10–49)
AST: 29 U/L (ref 0.0–33.9)
Albumin: 4.2 gm/dl (ref 3.4–5.0)
Alkaline Phosphatase: 83 U/L (ref 46–116)
Anion Gap: 3 mmol/L — ABNORMAL LOW (ref 5–15)
BUN: 14 mg/dl (ref 9–23)
CO2: 28 mEq/L (ref 20–31)
Calcium: 9.9 mg/dl (ref 8.7–10.4)
Chloride: 105 mEq/L (ref 98–107)
Creatinine: 1 mg/dl (ref 0.70–1.30)
GFR African American: >60
GFR Non-African American: >60
Glucose: 96 mg/dl (ref 74–106)
Potassium: 4.9 mEq/L (ref 3.5–5.1)
Sodium: 136 mEq/L (ref 136–145)
Total Bilirubin: 0.6 mg/dl (ref 0.30–1.20)
Total Protein: 7.5 gm/dl (ref 5.7–8.2)

## 2022-06-14 ENCOUNTER — Inpatient Hospital Stay: Admit: 2022-06-14 | Payer: PRIVATE HEALTH INSURANCE | Primary: Family

## 2022-06-14 DIAGNOSIS — E871 Hypo-osmolality and hyponatremia: Secondary | ICD-10-CM

## 2022-06-14 LAB — BASIC METABOLIC PANEL
Anion Gap: 4 mmol/L — ABNORMAL LOW (ref 5–15)
BUN: 12 mg/dl (ref 9–23)
CO2: 28 mEq/L (ref 20–31)
Calcium: 10 mg/dl (ref 8.7–10.4)
Chloride: 101 mEq/L (ref 98–107)
Creatinine: 0.85 mg/dl (ref 0.70–1.30)
GFR African American: >60
GFR Non-African American: >60
Glucose: 90 mg/dl (ref 74–106)
Potassium: 5.1 mEq/L (ref 3.5–5.1)
Sodium: 133 mEq/L — ABNORMAL LOW (ref 136–145)

## 2022-06-22 ENCOUNTER — Inpatient Hospital Stay: Admit: 2022-06-22 | Payer: PRIVATE HEALTH INSURANCE | Primary: Family

## 2022-06-22 DIAGNOSIS — E871 Hypo-osmolality and hyponatremia: Secondary | ICD-10-CM

## 2022-06-22 LAB — BASIC METABOLIC PANEL
Anion Gap: 5 mmol/L (ref 5–15)
BUN: 17 mg/dl (ref 9–23)
CO2: 28 mEq/L (ref 20–31)
Calcium: 9.8 mg/dl (ref 8.7–10.4)
Chloride: 105 mEq/L (ref 98–107)
Creatinine: 0.88 mg/dl (ref 0.70–1.30)
GFR African American: >60
GFR Non-African American: >60
Glucose: 99 mg/dl (ref 74–106)
Potassium: 4.6 mEq/L (ref 3.5–5.1)
Sodium: 137 mEq/L (ref 136–145)

## 2022-09-05 DIAGNOSIS — E782 Mixed hyperlipidemia: Secondary | ICD-10-CM

## 2022-09-06 ENCOUNTER — Inpatient Hospital Stay: Admit: 2022-09-06 | Payer: PRIVATE HEALTH INSURANCE | Primary: Family

## 2022-09-06 LAB — CBC WITH AUTO DIFFERENTIAL
Basophils: 0.4 % (ref 0–3)
Eosinophils: 3.8 % (ref 0–5)
Hematocrit: 39.9 % (ref 36.0–55.0)
Hemoglobin: 12.3 gm/dl — ABNORMAL LOW (ref 13.0–18.0)
Immature Granulocytes %: 0.2 % (ref 0.0–3.0)
Lymphocytes: 17.6 % — ABNORMAL LOW (ref 28–48)
MCH: 24.9 pg (ref 23.0–34.6)
MCHC: 30.8 gm/dl (ref 30.0–36.0)
MCV: 80.8 fL (ref 80.0–98.0)
MPV: 11.5 fL — ABNORMAL HIGH (ref 6.0–10.0)
Monocytes: 10.3 % (ref 1–13)
Neutrophils Segmented: 67.7 % — ABNORMAL HIGH (ref 34–64)
Nucleated RBCs: 0 (ref 0–0)
Platelets: 342 10*3/uL (ref 140–450)
RBC: 4.94 M/uL (ref 3.80–5.70)
RDW: 45.2 — ABNORMAL HIGH (ref 35.1–43.9)
WBC: 8.2 10*3/uL (ref 4.0–11.0)

## 2022-09-06 LAB — COMPREHENSIVE METABOLIC PANEL
ALT: 21 U/L (ref 10–49)
AST: 21 U/L (ref 0.0–33.9)
Albumin: 3.7 gm/dl (ref 3.4–5.0)
Alkaline Phosphatase: 92 U/L (ref 46–116)
Anion Gap: 6 mmol/L (ref 5–15)
BUN: 9 mg/dl (ref 9–23)
CO2: 27 mEq/L (ref 20–31)
Calcium: 9.8 mg/dl (ref 8.7–10.4)
Chloride: 105 mEq/L (ref 98–107)
Creatinine: 0.86 mg/dl (ref 0.70–1.30)
GFR African American: >60
GFR Non-African American: >60
Glucose: 103 mg/dl (ref 74–106)
Potassium: 4.6 mEq/L (ref 3.5–5.1)
Sodium: 138 mEq/L (ref 136–145)
Total Bilirubin: 0.4 mg/dl (ref 0.30–1.20)
Total Protein: 6.8 gm/dl (ref 5.7–8.2)

## 2022-09-06 LAB — HEMOGLOBIN A1C: Hemoglobin A1C: 5.6 % (ref 3.8–5.6)

## 2022-09-06 LAB — LIPID PANEL
Chol/HDL Ratio: 4.1 Ratio (ref 0.0–5.0)
Cholesterol, Total: 163 mg/dl (ref 0–199)
HDL: 40 mg/dl (ref 40–60)
LDL Cholesterol: 95 mg/dl (ref 0–130)
Triglycerides: 141 mg/dl (ref 0–150)

## 2022-09-16 NOTE — Telephone Encounter (Signed)
Patient sent email message about scheduling xiaflex injection.  His tel: [phone]

## 2022-09-16 NOTE — Telephone Encounter (Signed)
LMOVM for patient to return call to discuss [name].  Left call back information.

## 2022-09-20 NOTE — Telephone Encounter (Signed)
 LMOVM for patient to return call to discuss [name].  Provided direct call back information.

## 2022-09-30 NOTE — Telephone Encounter (Signed)
 LMOVM for patient to check his My chart message to sign the [employer] paperwork so benefits can be started.    Asked that he return the call if he has questions/concerns.

## 2022-09-30 NOTE — Telephone Encounter (Signed)
 LMOVM for patient to make him aware that updated consents were sent to him via my chart. Explained on VM that he will need to sign the highlighted area and send back to the office so BV can be completed.

## 2022-10-10 NOTE — Telephone Encounter (Signed)
 LMOVM for patient to return to schedule [name].  Provided direct call back information.

## 2022-11-09 NOTE — Telephone Encounter (Signed)
Spoke with patient and discussed [name] treatments. Patient aware of how the cycles work. 4 cycles, with 2 injection per cycle, spaced out every 6 weeks.    Patient expresses desire to proceed.  He is also aware that his treatment will extend over into the new year which. It has been explained that he will be responsible to meet his deductible and make payments at the time of service. He expressed understanding.  We have also discussed that if insurance does not approve or requests additional documentation for treatment, his appointment will be cancelled. An appointment for re-evaluation will be schedule so documents can be updated.    Patient expressed understanding.

## 2022-11-09 NOTE — Telephone Encounter (Signed)
Per orders of Dr. Helene Shoe.  I have referred Nathan Mathis to the Benefits Dept to begin benefit verification for [name].          11/09/2022   Dosage:  0.9 mg X 2  Frequency:  Q 6 weeks   Appt Date: 11/29/2022 & 12/02/2022   Dx Code:  N48.6  Requested medication has been checked for any interactions with current medications: Yes

## 2022-11-28 NOTE — Telephone Encounter (Signed)
Per Lattie Haw on file, no ded pt resp for 20% cost of xiaflex & $20 ofc copay for admin ([id number]) dx: n48.6, pt elig for endo assistance, but has to pay ofc copay.

## 2022-11-29 ENCOUNTER — Encounter: Attending: Student in an Organized Health Care Education/Training Program | Primary: Family

## 2022-12-02 ENCOUNTER — Encounter: Attending: Student in an Organized Health Care Education/Training Program | Primary: Family

## 2023-03-21 ENCOUNTER — Inpatient Hospital Stay: Admit: 2023-03-21 | Payer: PRIVATE HEALTH INSURANCE | Primary: Family

## 2023-03-21 DIAGNOSIS — I10 Essential (primary) hypertension: Secondary | ICD-10-CM

## 2023-03-22 LAB — LIPID PANEL
Chol/HDL Ratio: 4.9 {ratio} (ref 0.0–5.0)
Cholesterol, Total: 237 mg/dL — ABNORMAL HIGH (ref 0–199)
HDL: 48 mg/dL (ref 40–60)
LDL Cholesterol: 160 mg/dL — ABNORMAL HIGH (ref 0–130)
Triglycerides: 146 mg/dL (ref 0–150)

## 2023-03-22 LAB — COMPREHENSIVE METABOLIC PANEL
ALT: 24 U/L (ref 10–49)
AST: 23 U/L (ref 0.0–33.9)
Albumin: 4 g/dL (ref 3.4–5.0)
Alkaline Phosphatase: 95 U/L (ref 46–116)
Anion Gap: 5 mmol/L (ref 5–15)
BUN: 14 mg/dL (ref 9–23)
CO2: 29 meq/L (ref 20–31)
Calcium: 9.9 mg/dL (ref 8.7–10.4)
Chloride: 101 meq/L (ref 98–107)
Creatinine: 1.04 mg/dL (ref 0.70–1.30)
GFR African American: >60
GFR Non-African American: >60
Glucose: 96 mg/dL (ref 74–106)
Potassium: 5.3 meq/L — ABNORMAL HIGH (ref 3.5–5.1)
Sodium: 135 meq/L — ABNORMAL LOW (ref 136–145)
Total Bilirubin: 0.6 mg/dL (ref 0.30–1.20)
Total Protein: 7.4 g/dL (ref 5.7–8.2)

## 2023-03-22 LAB — CBC WITH AUTO DIFFERENTIAL
Basophils: 0.6 % (ref 0–3)
Eosinophils: 1.7 % (ref 0–5)
Hematocrit: 49.5 % (ref 36.0–55.0)
Hemoglobin: 15.6 g/dL (ref 13.0–18.0)
Immature Granulocytes %: 0.1 % (ref 0.0–3.0)
Lymphocytes: 23.5 % — ABNORMAL LOW (ref 28–48)
MCH: 28.1 pg (ref 23.0–34.6)
MCHC: 31.5 g/dL (ref 30.0–36.0)
MCV: 89.2 fL (ref 80.0–98.0)
MPV: 10.6 fL — ABNORMAL HIGH (ref 6.0–10.0)
Monocytes: 10.5 % (ref 1–13)
Neutrophils Segmented: 63.6 % (ref 34–64)
Nucleated RBCs: 0 (ref 0–0)
Platelets: 365 10*3/uL (ref 140–450)
RBC: 5.55 M/uL (ref 3.80–5.70)
RDW: 51.9 — ABNORMAL HIGH (ref 35.1–43.9)
WBC: 7.7 10*3/uL (ref 4.0–11.0)

## 2023-03-22 LAB — HEMOGLOBIN A1C: Hemoglobin A1C: 5.7 % — ABNORMAL HIGH (ref 3.8–5.6)

## 2023-03-22 LAB — PSA SCREENING: PSA: 0.45 ng/mL (ref 0.00–4.00)

## 2023-03-29 DIAGNOSIS — Z5181 Encounter for therapeutic drug level monitoring: Secondary | ICD-10-CM

## 2023-03-30 ENCOUNTER — Inpatient Hospital Stay: Admit: 2023-03-30 | Payer: PRIVATE HEALTH INSURANCE | Primary: Family

## 2023-03-30 LAB — URINE DRUG SCREEN
Amphetamine: NEGATIVE
Barbiturates, Urine: NEGATIVE
Benzodiazepines, Urine: NEGATIVE
Cocaine: NEGATIVE
Marijuana: NEGATIVE
Methadone Screen, Urine: NEGATIVE
Opiates, Urine: NEGATIVE
Phencyclidine: NEGATIVE

## 2023-09-20 ENCOUNTER — Inpatient Hospital Stay: Admit: 2023-09-20 | Payer: PRIVATE HEALTH INSURANCE | Primary: Family

## 2023-09-20 LAB — BASIC METABOLIC PANEL
Anion Gap: 12 mmol/L (ref 5–15)
BUN: 7 mg/dL — ABNORMAL LOW (ref 9–23)
CO2: 26 meq/L (ref 20–31)
Calcium: 9.9 mg/dL (ref 8.7–10.4)
Chloride: 104 meq/L (ref 98–107)
Creatinine: 1.09 mg/dL (ref 0.70–1.30)
GFR African American: >60
GFR Non-African American: >60
Glucose: 70 mg/dL — ABNORMAL LOW (ref 74–106)
Potassium: 4.5 meq/L (ref 3.5–5.1)
Sodium: 141 meq/L (ref 136–145)

## 2023-09-20 LAB — LIPID PANEL
Chol/HDL Ratio: 5 ratio (ref 0.0–5.0)
Cholesterol, Total: 205 mg/dL — ABNORMAL HIGH (ref 0–199)
HDL: 41 mg/dL (ref 40–60)
LDL Cholesterol: 134 mg/dL — ABNORMAL HIGH (ref 0–130)
Triglycerides: 152 mg/dL — ABNORMAL HIGH (ref 0–150)
